# Patient Record
Sex: Male | Born: 1960
Health system: Southern US, Community
[De-identification: ages and names within clinical notes are randomized; demographics above are authoritative.]

## PROBLEM LIST (undated history)

## (undated) DIAGNOSIS — H9192 Unspecified hearing loss, left ear: Secondary | ICD-10-CM

## (undated) DIAGNOSIS — K469 Unspecified abdominal hernia without obstruction or gangrene: Secondary | ICD-10-CM

## (undated) DIAGNOSIS — K219 Gastro-esophageal reflux disease without esophagitis: Secondary | ICD-10-CM

## (undated) DIAGNOSIS — R42 Dizziness and giddiness: Secondary | ICD-10-CM

## (undated) DIAGNOSIS — G709 Myoneural disorder, unspecified: Secondary | ICD-10-CM

## (undated) DIAGNOSIS — I10 Essential (primary) hypertension: Secondary | ICD-10-CM

## (undated) DIAGNOSIS — E785 Hyperlipidemia, unspecified: Secondary | ICD-10-CM

## (undated) DIAGNOSIS — N529 Male erectile dysfunction, unspecified: Secondary | ICD-10-CM

## (undated) HISTORY — DX: Hyperlipidemia, unspecified: E78.5

## (undated) HISTORY — PX: LUMBAR DISC SURGERY: SHX700

## (undated) HISTORY — PX: OTHER SURGICAL HISTORY: SHX169

## (undated) HISTORY — DX: Dizziness and giddiness: R42

## (undated) HISTORY — DX: Myoneural disorder, unspecified: G70.9

## (undated) HISTORY — PX: HERNIA REPAIR: SHX51

## (undated) HISTORY — DX: Gastro-esophageal reflux disease without esophagitis: K21.9

## (undated) HISTORY — DX: Essential (primary) hypertension: I10

## (undated) HISTORY — DX: Male erectile dysfunction, unspecified: N52.9

## (undated) HISTORY — DX: Unspecified hearing loss, left ear: H91.92

---

## 1999-10-29 ENCOUNTER — Inpatient Hospital Stay (HOSPITAL_COMMUNITY): Admission: EM | Admit: 1999-10-29 | Discharge: 1999-11-02 | Payer: Self-pay | Admitting: Emergency Medicine

## 1999-10-30 ENCOUNTER — Encounter: Payer: Self-pay | Admitting: Internal Medicine

## 1999-11-02 ENCOUNTER — Inpatient Hospital Stay (HOSPITAL_COMMUNITY): Admission: AD | Admit: 1999-11-02 | Discharge: 1999-11-09 | Payer: Self-pay | Admitting: *Deleted

## 1999-12-06 ENCOUNTER — Other Ambulatory Visit: Admission: RE | Admit: 1999-12-06 | Discharge: 1999-12-17 | Payer: Self-pay

## 2000-02-18 ENCOUNTER — Inpatient Hospital Stay (HOSPITAL_COMMUNITY): Admission: EM | Admit: 2000-02-18 | Discharge: 2000-02-19 | Payer: Self-pay | Admitting: Emergency Medicine

## 2000-02-18 ENCOUNTER — Encounter: Payer: Self-pay | Admitting: Emergency Medicine

## 2000-02-19 ENCOUNTER — Inpatient Hospital Stay (HOSPITAL_COMMUNITY): Admission: EM | Admit: 2000-02-19 | Discharge: 2000-02-21 | Payer: Self-pay | Admitting: *Deleted

## 2002-08-24 ENCOUNTER — Encounter: Admission: RE | Admit: 2002-08-24 | Discharge: 2002-08-24 | Payer: Self-pay | Admitting: Neurosurgery

## 2002-08-24 ENCOUNTER — Encounter: Payer: Self-pay | Admitting: Neurosurgery

## 2002-09-07 ENCOUNTER — Encounter: Admission: RE | Admit: 2002-09-07 | Discharge: 2002-09-07 | Payer: Self-pay | Admitting: Neurosurgery

## 2002-09-07 ENCOUNTER — Encounter: Payer: Self-pay | Admitting: Neurosurgery

## 2002-11-28 ENCOUNTER — Encounter: Payer: Self-pay | Admitting: Neurosurgery

## 2002-11-30 ENCOUNTER — Encounter: Payer: Self-pay | Admitting: Neurosurgery

## 2002-11-30 ENCOUNTER — Ambulatory Visit (HOSPITAL_COMMUNITY): Admission: RE | Admit: 2002-11-30 | Discharge: 2002-12-01 | Payer: Self-pay | Admitting: Neurosurgery

## 2006-04-17 ENCOUNTER — Ambulatory Visit: Payer: Self-pay | Admitting: Family Medicine

## 2006-05-29 ENCOUNTER — Ambulatory Visit: Payer: Self-pay | Admitting: Family Medicine

## 2006-08-07 ENCOUNTER — Ambulatory Visit: Payer: Self-pay | Admitting: Family Medicine

## 2006-08-14 ENCOUNTER — Ambulatory Visit: Payer: Self-pay | Admitting: Family Medicine

## 2007-12-30 ENCOUNTER — Ambulatory Visit: Payer: Self-pay | Admitting: Family Medicine

## 2008-12-11 ENCOUNTER — Ambulatory Visit: Payer: Self-pay | Admitting: Family Medicine

## 2010-03-28 ENCOUNTER — Ambulatory Visit: Payer: Self-pay | Admitting: Family Medicine

## 2010-04-29 ENCOUNTER — Ambulatory Visit: Payer: Self-pay | Admitting: Family Medicine

## 2010-12-30 ENCOUNTER — Ambulatory Visit (INDEPENDENT_AMBULATORY_CARE_PROVIDER_SITE_OTHER): Payer: PRIVATE HEALTH INSURANCE | Admitting: Family Medicine

## 2010-12-30 DIAGNOSIS — R51 Headache: Secondary | ICD-10-CM

## 2010-12-30 DIAGNOSIS — I10 Essential (primary) hypertension: Secondary | ICD-10-CM

## 2011-01-28 ENCOUNTER — Ambulatory Visit (INDEPENDENT_AMBULATORY_CARE_PROVIDER_SITE_OTHER): Payer: PRIVATE HEALTH INSURANCE | Admitting: Family Medicine

## 2011-01-28 DIAGNOSIS — Z79899 Other long term (current) drug therapy: Secondary | ICD-10-CM

## 2011-01-28 DIAGNOSIS — I10 Essential (primary) hypertension: Secondary | ICD-10-CM

## 2011-01-28 DIAGNOSIS — F528 Other sexual dysfunction not due to a substance or known physiological condition: Secondary | ICD-10-CM

## 2011-03-03 ENCOUNTER — Ambulatory Visit: Payer: PRIVATE HEALTH INSURANCE | Admitting: Family Medicine

## 2011-04-04 NOTE — H&P (Signed)
[place]. [facility]  Patient:    Troy Garza, Troy Garza                          MRN: [id number] Adm. Date:  [id number] Attending:  Carollee Herter                         History and Physical  HISTORY:  This is a 50 year old white male who is admitted through the emergency room following an overdose of Remeron and an over-the-counter sleep medication in a suicide attempt.  He does have a previous suicide attempt and admission in December 2000; is presently being taken care of by Dr. Constance Holster.  He apparently took the above-mentioned pills last night around 11 p.m.; this morning was found by his mother to be unsteady on his feet, with weakness and shaking.  He was brought to the emergency room.  In the emergency room he was noted to be disoriented and hallucinating, and an extensive evaluation was performed.  This included CT scan, drug screening and blood studies; all of which were negative.  Over the course of his emergency room stay he did slowly improve, although initially he was hallucinating. When I saw him around 6 oclock he was alert and oriented, but did complain of some dizziness and shakiness.  PAST MEDICAL HISTORY:  Unremarkable, except as above; other than he does have a history of hypertension and does take Altace.  SOCIAL HISTORY:  Negative, except as above.  He had a hospitalization in December 2000 for overdose.  ALLERGIES:  None noted.  PHYSICAL EXAMINATION:  GENERAL:  Reveals a white male in no acute distress, who was alert and oriented x 3.  Does complain of being slightly dizzy and shaky.  HEENT:  TMs are normal.  Fundi not seen.  Throat is clear, although black material is present in the throat, indicating the use of activated charcoal.  NECK:  Supple without adenopathy or thyromegaly.  CARDIAC:  Shows a regular rhythm without murmurs of gallops.  LUNGS:  Clear to auscultation.  ABDOMEN:  Shows no hepatosplenomegaly, masses  or tenderness.  GENITAL/RECTAL:  Deferred.  EXTREMITIES:  Deep tendon reflexes are 2+ pulses and reflexes.  ASSESSMENT:  Suicide attempt, with overdose of Remeron and over-the-counter sleeping pill.  PLAN:  He is to be admitted to a telemetry bed with a sitter.  He is to be reevaluated in the morning by the psychiatric team for transfer to the psychiatric unit. DD:  02/18/00 TD:  02/18/00 Job: [id number]

## 2011-04-04 NOTE — H&P (Signed)
   NAMEBERNHARDT, RIEMENSCHNEIDER                             ACCOUNT NO.:  [id number]   MEDICAL RECORD NO.:  [id number]                   PATIENT TYPE:  OIB   LOCATION:  NA                                   FACILITY:  [facility]   PHYSICIAN:  Hilda Lias, M.D.                DATE OF BIRTH:  1960/12/27   DATE OF ADMISSION:  DATE OF DISCHARGE:                                HISTORY & PHYSICAL   HISTORY:  Troy Garza was seen by me initially in September 2003 and later on  in December because of a history of while Troy Garza was walking, bent over.  Immediately his foot got caught, twisted and Troy Garza developed some onset of back  pain that radiated to the left foot.  The patient is quite miserable.  Troy Garza  has had conservative treatment and Troy Garza has had no improvement whatsoever.  Troy Garza  denies any problem with the opposite leg.  Troy Garza is quite miserable .  When Troy Garza  came to see me Troy Garza was limping from the left leg.  Troy Garza had an MRI and sent for  evaluation.   PAST MEDICAL HISTORY:  Negative.   ALLERGIES:  Troy Garza is allergic to PENICILLIN.   SOCIAL HISTORY:  Troy Garza does not smoke.   FAMILY HISTORY:  Unremarkable.   REVIEW OF SYSTEMS:  Positive for high blood pressure, back pain, left leg  pain.   PHYSICAL EXAMINATION:  On physical examination patient came to the office,  limps on the left leg.  HEENT:  Normal.  NECK:  Normal.  LUNGS:  Clear.  HEART:  Heart sounds normal.  ABDOMEN:  Normal.  NEUROLOGIC:  Mental status - cranial nerves normal.  Strength 5/5, intact.  EXTREMITIES:  On the left foot Troy Garza has 3/5 plantar flexion.  Sensation is  normal.  Reflexes symmetrical with absence of the  left ankle jerk.   DIAGNOSTIC STUDIES:  The MRI shows that Troy Garza has a large herniated disk at L5-  1 central to the left with displacement of not only the thecal sac but also  the S1 nerve root.  Troy Garza also has a small midline disk between 4-5.   CLINICAL IMPRESSION:  Left L5-S1 herniated disc due to displacement of the  thecal sac.   RECOMMENDATIONS:  The patient will be admitted for surgery.  Procedure will  be L5-S1 diskectomy foraminotomy.  Troy Garza also knows the risks such as  infection, CSF leak, worsening pain, paralysis, 10% recurrence rate.                                               Hilda Lias, M.D.    EB/MEDQ  D:  11/30/2002  T:  11/30/2002  Job:  [id number]

## 2011-04-04 NOTE — Op Note (Signed)
   Troy Garza, Troy Garza                             ACCOUNT NO.:  [id number]   MEDICAL RECORD NO.:  [id number]                   PATIENT TYPE:  OIB   LOCATION:  NA                                   FACILITY:  [facility]   PHYSICIAN:  Hilda Lias, M.D.                DATE OF BIRTH:  09/20/1961   DATE OF PROCEDURE:  DATE OF DISCHARGE:                                 OPERATIVE REPORT   PREOPERATIVE DIAGNOSIS:  Left L5-S1 herniated disk with a fragment.   POSTOPERATIVE DIAGNOSIS:  Left L5-S1 herniated disk with a fragment.   PROCEDURE:  Left L5-S1 diskectomy using a Medx system, CR microscope.   DESCRIPTION OF PROCEDURE:  The patient was admitted, having back and left  leg pain for several months after he was inactive at work.  He failed  conservative treatment.  MRI showed herniated disk at L5-1 with displacement  of the thecal sac.  Surgery was advised.   The patient was taken to the operating room after intubation and he was  proceeded.  The back was prepped with Betadine.  The area was draped.  We  then brought the C-arm, localizing the AP view with L5-1 on the left side.  We made an incision through the skin, subcutaneous tissue and fascia.  Dilators were inserted, we were able to visualize the area between 5-1.  The  microscope was brought into the area.  With the drill, we drilled the lower  lamina of L5 and the upper S1 as well as part of the 1/3 of the medial  facet.  Ligament was also excised.  From then on, I identified the S1 root  which was swollen and reddish.  We did retraction and indeed, with  retraction we found that there was a large herniated disk with fragment  going into the L5, down to S1.  An incision was made and large amount of  degenerative disk was removed.  Total diskectomy, medial and laterally was  accomplished.  From there, we investigated the area above and below.  Having  good foraminotomy, the area was irrigated, Valsalva maneuver was negative.  Then,  Fentanyl and Depo-Medrol were left in the epidural space and the wound  was closed with Vicryl and Steri-Strips.                                               Hilda Lias, M.D.    EB/MEDQ  D:  11/30/2002  T:  11/30/2002  Job:  [id number]

## 2011-04-16 ENCOUNTER — Telehealth: Payer: Self-pay | Admitting: Family Medicine

## 2011-04-16 NOTE — Telephone Encounter (Signed)
Cialis is giving pt a constant head ache.  Pt would like to switch back to the Viagra, it works well.  [facility] [address].

## 2011-04-17 ENCOUNTER — Telehealth: Payer: Self-pay | Admitting: Family Medicine

## 2011-04-17 MED ORDER — SILDENAFIL CITRATE 100 MG PO TABS: 100.0000 mg | ORAL_TABLET | ORAL | Status: DC | PRN

## 2011-04-17 NOTE — Telephone Encounter (Signed)
Left msg, Rx [facility]

## 2011-04-17 NOTE — Telephone Encounter (Signed)
Apparently Cialis is causing problems with headaches. He would like to be switched back to Viagra.

## 2011-05-07 ENCOUNTER — Other Ambulatory Visit: Payer: Self-pay | Admitting: Family Medicine

## 2011-07-15 ENCOUNTER — Telehealth (INDEPENDENT_AMBULATORY_CARE_PROVIDER_SITE_OTHER): Payer: Self-pay | Admitting: General Surgery

## 2011-07-15 ENCOUNTER — Encounter (INDEPENDENT_AMBULATORY_CARE_PROVIDER_SITE_OTHER): Payer: Self-pay | Admitting: Surgery

## 2011-07-15 ENCOUNTER — Ambulatory Visit (INDEPENDENT_AMBULATORY_CARE_PROVIDER_SITE_OTHER): Payer: Self-pay | Admitting: Surgery

## 2011-07-15 VITALS — BP 156/100 | HR 81 | Temp 98.7°F | Ht 67.0 in | Wt 188.2 lb

## 2011-07-15 DIAGNOSIS — K429 Umbilical hernia without obstruction or gangrene: Secondary | ICD-10-CM | POA: Insufficient documentation

## 2011-07-15 NOTE — Patient Instructions (Signed)
My office will call you to schedule surgery.

## 2011-07-15 NOTE — Progress Notes (Signed)
Chief Complaint  Patient presents with  . Umbilical Hernia    HPI Troy Garza is a 50 y.o. male referred by [facility] for evaluation of an umbilical hernia. The patient was at work recently when he lifted a heavy object about 60 pounds. He felt a tearing sensation at his umbilicus and subsequently has developed a bulge in this area. He has been limiting his lifting but still has some discomfort in this area. He was diagnosed with an umbilical hernia and is referred now for surgical evaluation HPI  Past Medical History  Diagnosis Date  . Hypertension     Past Surgical History  Procedure Date  . Lumbar disc surgery     Family History  Problem Relation Age of Onset  . Hypertension Father     Social History History  Substance Use Topics  . Smoking status: Never Smoker   . Smokeless tobacco: Not on file  . Alcohol Use: Yes    Allergies  Allergen Reactions  . Penicillins Hives    Current Outpatient Prescriptions  Medication Sig Dispense Refill  . amLODipine (NORVASC) 5 MG tablet TAKE 1 TABLET EVERY DAY  30 tablet  2  . sildenafil (VIAGRA) 100 MG tablet Take 1 tablet (100 mg total) by mouth as needed for erectile dysfunction.  6 tablet  11    Review of Systems ROS Reviewed with the patient and is otherwise negative. Blood pressure 156/100, pulse 81, temperature 98.7 F (37.1 C), temperature source Oral, height 5\' 7"  (1.702 m), weight 188 lb 4 oz (85.39 kg).  Physical Exam Physical Exam  WDWN in NAD HEENT:  EOMI, sclera anicteric Neck:  No masses, no thyromegaly Lungs:  CTA bilaterally; normal respiratory effort CV:  Regular rate and rhythm; no murmurs Abd:  +bowel sounds, soft, non-tender, protruding umbilicus with partially reducible hernia. Upper midline rectus diastasis Ext:  Well-perfused; no edema Skin:  Warm, dry; no sign of jaundice  Data Reviewed None  Assessment    Umbilical hernia caused by heavy lifting. Asymptomatic rectus  diastases    Plan    Umbilical hernia repair with mesh. The surgical procedure has been discussed with the patient.  Potential risks, benefits, alternative treatments, and expected outcomes have been explained.  All of the patient's questions at this time have been answered.  The patient understand the proposed surgical procedure and wishes to proceed.        TSUEI,MATTHEW K. 07/15/2011, 3:35 PM

## 2011-07-22 ENCOUNTER — Ambulatory Visit (INDEPENDENT_AMBULATORY_CARE_PROVIDER_SITE_OTHER): Payer: Self-pay | Admitting: General Surgery

## 2011-08-08 ENCOUNTER — Encounter (HOSPITAL_BASED_OUTPATIENT_CLINIC_OR_DEPARTMENT_OTHER)
Admission: RE | Admit: 2011-08-08 | Discharge: 2011-08-08 | Disposition: A | Discharge status: Home / Self Care | Payer: Worker's Compensation | Source: Ambulatory Visit | Attending: Surgery | Admitting: Surgery

## 2011-08-08 LAB — BASIC METABOLIC PANEL
BUN: 12 mg/dL (ref 6–23)
CO2: 25 mEq/L (ref 19–32)
Calcium: 9.4 mg/dL (ref 8.4–10.5)
Chloride: 102 mEq/L (ref 96–112)
Creatinine, Ser: 0.83 mg/dL (ref 0.50–1.35)
GFR calc Af Amer: >60 mL/min (ref >60)
GFR calc non Af Amer: >60 mL/min (ref >60)
Glucose, Bld: 88 mg/dL (ref 70–99)
Potassium: 3.5 mEq/L (ref 3.5–5.1)
Sodium: 138 mEq/L (ref 135–145)

## 2011-08-11 NOTE — Progress Notes (Signed)
Quick Note:  This patient may proceed with surgery ______ 

## 2011-08-12 ENCOUNTER — Ambulatory Visit (HOSPITAL_BASED_OUTPATIENT_CLINIC_OR_DEPARTMENT_OTHER)
Admission: RE | Admit: 2011-08-12 | Discharge: 2011-08-12 | Disposition: A | Discharge status: Home / Self Care | Payer: Worker's Compensation | Source: Ambulatory Visit | Attending: Surgery | Admitting: Surgery

## 2011-08-12 DIAGNOSIS — Z01812 Encounter for preprocedural laboratory examination: Secondary | ICD-10-CM | POA: Insufficient documentation

## 2011-08-12 DIAGNOSIS — I10 Essential (primary) hypertension: Secondary | ICD-10-CM | POA: Insufficient documentation

## 2011-08-12 DIAGNOSIS — K429 Umbilical hernia without obstruction or gangrene: Secondary | ICD-10-CM

## 2011-08-12 DIAGNOSIS — Z01810 Encounter for preprocedural cardiovascular examination: Secondary | ICD-10-CM | POA: Insufficient documentation

## 2011-08-12 HISTORY — PX: UMBILICAL HERNIA REPAIR: SHX196

## 2011-08-13 NOTE — Op Note (Signed)
  [name], Troy Garza                 ACCOUNT NO.:  [id number]  MEDICAL RECORD NO.:  [id number]  LOCATION:                                 FACILITY:  PHYSICIAN:  Wilmon Arms. Corliss Skains, M.D.      DATE OF BIRTH:  DATE OF PROCEDURE:  08/12/2011 DATE OF DISCHARGE:                              OPERATIVE REPORT   PREOPERATIVE DIAGNOSIS:  Umbilical hernia.  POSTOPERATIVE DIAGNOSIS:  Umbilical hernia.  PROCEDURE:  Umbilical hernia repair with mesh.  SURGEON:  Wilmon Arms. Corliss Skains, MD  ANESTHESIA:  General.  INDICATIONS:  This is a 50 year old male who was recently at work lifting a heavy object when he felt a tearing sensation in his umbilicus.  Subsequently he developed a bulge in the area.  I examined and felt that he had umbilical hernia and recommended repair.  DESCRIPTION OF PROCEDURE:  The patient was brought to the operating room, placed in a supine position on the operating table.  After an adequate level of general anesthesia was obtained, the patient's periumbilical region was shaved, prepped with ChloraPrep, draped in sterile fashion.  Time-out was taken to assure proper patient and proper procedure.  We made a transverse incision below his umbilicus. Dissection was carried down in the subcutaneous tissues with cautery. We dissect the hernia sac free from the umbilical skin.  The hernia sac was dissected all the way down to the fascia.  The hernia was reduced back in the preperitoneal space.  The defect was only about 15 cm across.  We used a 4.3 cm Proceed ventral patch and inserted this in the preperitoneal space.  This was secured with four interrupted 0Novafil sutures.  The fascia was reapproximated with #1 Novafil sutures.  The base of the umbilicus was tacked down with a 3-0 Vicryl.  3-0 Vicryl was used to close the subcutaneous tissues and 4-0 Monocryl was used to close the skin.  Steri-Strips and clean dressings were applied.  The patient was extubated and brought to the  recovery room in a stable condition.  All sponge, instrument, and needle counts were correct.     Wilmon Arms. Corliss Skains, M.D.     MKT/MEDQ  D:  08/12/2011  T:  08/12/2011  Job:  [id number]  Electronically Signed by Manus Rudd M.D. on 08/13/2011 01:37:07 PM

## 2011-08-14 ENCOUNTER — Other Ambulatory Visit: Payer: Self-pay | Admitting: Family Medicine

## 2011-08-14 DIAGNOSIS — I10 Essential (primary) hypertension: Secondary | ICD-10-CM

## 2011-08-14 NOTE — Telephone Encounter (Signed)
Unsure of strength for Norvasc 5 or inc to 10mg .  Please review

## 2011-08-15 ENCOUNTER — Other Ambulatory Visit: Payer: Self-pay

## 2011-08-15 MED ORDER — AMLODIPINE BESYLATE 5 MG PO TABS: 5.0000 mg | ORAL_TABLET | Freq: Every day | ORAL | Status: DC

## 2011-08-15 MED ORDER — AMLODIPINE BESYLATE 10 MG PO TABS: 10.0000 mg | ORAL_TABLET | Freq: Every day | ORAL | Status: DC

## 2011-08-15 NOTE — Telephone Encounter (Signed)
Have sent in new strength

## 2011-08-15 NOTE — Telephone Encounter (Signed)
In his chart he has been changed to 10 mg norvasc sent in new RX and discontinue 5 mg

## 2011-08-15 NOTE — Progress Notes (Signed)
iiiiiii

## 2011-08-15 NOTE — Progress Notes (Signed)
Addended by: Laureen Ochs F on: 08/15/2011 01:44 PM   Modules accepted: Orders

## 2011-08-19 ENCOUNTER — Encounter (INDEPENDENT_AMBULATORY_CARE_PROVIDER_SITE_OTHER): Payer: Self-pay | Admitting: Surgery

## 2011-08-25 ENCOUNTER — Encounter (INDEPENDENT_AMBULATORY_CARE_PROVIDER_SITE_OTHER): Payer: Self-pay | Admitting: Surgery

## 2011-08-28 ENCOUNTER — Ambulatory Visit (INDEPENDENT_AMBULATORY_CARE_PROVIDER_SITE_OTHER): Payer: Worker's Compensation | Admitting: Surgery

## 2011-08-28 ENCOUNTER — Encounter (INDEPENDENT_AMBULATORY_CARE_PROVIDER_SITE_OTHER): Payer: Self-pay | Admitting: Surgery

## 2011-08-28 VITALS — BP 126/96 | HR 80 | Temp 98.5°F | Resp 20 | Ht 68.0 in | Wt 192.0 lb

## 2011-08-28 DIAGNOSIS — K429 Umbilical hernia without obstruction or gangrene: Secondary | ICD-10-CM

## 2011-08-28 NOTE — Patient Instructions (Signed)
Light activity - no lifting over 20 lbs for the next 3 weeks.  You may wear a back support to help protect your abdominal muscles.

## 2011-08-28 NOTE — Progress Notes (Signed)
Status post umbilical hernia on 08/12/11. We used a 4.3 cm piece of proceed ventral patch. Patient is doing well. He has a little bit of soreness at his umbilicus. Incision is healing well no sign of infection or recurrence. No seroma noted. The patient may resume light activity on Monday. He can followup with [place] on a p.r.n. basis.

## 2011-12-04 ENCOUNTER — Other Ambulatory Visit: Payer: Self-pay | Admitting: Family Medicine

## 2011-12-05 ENCOUNTER — Other Ambulatory Visit: Payer: Self-pay | Admitting: Family Medicine

## 2012-02-12 ENCOUNTER — Other Ambulatory Visit: Payer: Self-pay | Admitting: Family Medicine

## 2012-02-19 ENCOUNTER — Other Ambulatory Visit: Payer: Self-pay | Admitting: Family Medicine

## 2012-03-23 ENCOUNTER — Other Ambulatory Visit: Payer: Self-pay | Admitting: Family Medicine

## 2012-05-04 ENCOUNTER — Encounter: Payer: Self-pay | Admitting: Internal Medicine

## 2012-05-05 ENCOUNTER — Ambulatory Visit: Payer: Worker's Compensation | Admitting: Family Medicine

## 2012-05-06 ENCOUNTER — Telehealth: Payer: Self-pay | Admitting: Internal Medicine

## 2012-05-06 ENCOUNTER — Encounter: Payer: Worker's Compensation | Admitting: Family Medicine

## 2012-05-06 MED ORDER — LOSARTAN POTASSIUM-HCTZ 100-12.5 MG PO TABS: 1.0000 | ORAL_TABLET | Freq: Every day | ORAL | Status: DC

## 2012-05-06 NOTE — Telephone Encounter (Signed)
MED SENT IN 

## 2012-05-11 ENCOUNTER — Other Ambulatory Visit: Payer: Self-pay | Admitting: Family Medicine

## 2012-05-13 ENCOUNTER — Encounter: Payer: Worker's Compensation | Admitting: Family Medicine

## 2012-05-17 ENCOUNTER — Encounter: Payer: Self-pay | Admitting: Family Medicine

## 2012-05-17 ENCOUNTER — Ambulatory Visit (INDEPENDENT_AMBULATORY_CARE_PROVIDER_SITE_OTHER): Payer: BC Managed Care – PPO | Admitting: Family Medicine

## 2012-05-17 VITALS — BP 150/100 | HR 91 | Wt 198.0 lb

## 2012-05-17 DIAGNOSIS — I10 Essential (primary) hypertension: Secondary | ICD-10-CM

## 2012-05-17 MED ORDER — NEBIVOLOL HCL 5 MG PO TABS: 5.0000 mg | ORAL_TABLET | Freq: Every day | ORAL | Status: DC

## 2012-05-17 NOTE — Progress Notes (Signed)
  Subjective:    Patient ID: Troy Garza, male    DOB: 1961-04-02, 51 y.o.   MRN: [id number]  HPI He is here for recheck on his blood pressure. He has been taking both of his medications without difficulty. He does exercise and do aerobic activities regularly.  Review of Systems     Objective:   Physical Exam Alert and in no distress. Blood pressure is recorded.      Assessment & Plan:   1. Hypertension  nebivolol (BYSTOLIC) 5 MG tablet   I will have him stop the Norvasc since it does not seem to be doing any good. Switch to Bystolic. Encouraged him to continue with his physical activities.

## 2012-06-06 ENCOUNTER — Other Ambulatory Visit: Payer: Self-pay | Admitting: Family Medicine

## 2012-06-16 ENCOUNTER — Telehealth: Payer: Self-pay | Admitting: Family Medicine

## 2012-06-16 DIAGNOSIS — I10 Essential (primary) hypertension: Secondary | ICD-10-CM

## 2012-06-17 ENCOUNTER — Other Ambulatory Visit: Payer: Self-pay

## 2012-06-17 MED ORDER — NEBIVOLOL HCL 5 MG PO TABS: 5.0000 mg | ORAL_TABLET | Freq: Every day | ORAL | Status: DC

## 2012-06-17 NOTE — Telephone Encounter (Signed)
Called and left a message that sample box is ready for pick up at front

## 2012-06-17 NOTE — Telephone Encounter (Signed)
Give him samples 

## 2012-06-21 ENCOUNTER — Ambulatory Visit: Payer: BC Managed Care – PPO | Admitting: Family Medicine

## 2012-06-24 ENCOUNTER — Ambulatory Visit (INDEPENDENT_AMBULATORY_CARE_PROVIDER_SITE_OTHER): Payer: BC Managed Care – PPO | Admitting: Family Medicine

## 2012-06-24 ENCOUNTER — Encounter: Payer: Self-pay | Admitting: Family Medicine

## 2012-06-24 VITALS — BP 160/110 | HR 76 | Wt 193.0 lb

## 2012-06-24 DIAGNOSIS — I10 Essential (primary) hypertension: Secondary | ICD-10-CM | POA: Insufficient documentation

## 2012-06-24 MED ORDER — NEBIVOLOL HCL 10 MG PO TABS: 10.0000 mg | ORAL_TABLET | Freq: Every day | ORAL | Status: DC

## 2012-06-24 NOTE — Progress Notes (Signed)
  Subjective:    Patient ID: Troy Garza, male    DOB: 10-04-1961, 51 y.o.   MRN: [id number]  HPI He is here for recheck. He is not taking by systolic and Hyzaar. He is having no difficulty with them. He has been checking his pressures at home and his diastolic readings are in the low 90s.   Review of Systems     Objective:   Physical Exam Hurt and in no distress. Blood pressure is recorded       Assessment & Plan:   1. Hypertension  nebivolol (BYSTOLIC) 10 MG tablet  Bring a blood pressure cuff in and measured against ours. Take the 10 mg for the next 2 weeks and keep track of your pressure. If it's down in the 80s then stay on that dose and will see you in a month. Stay on your losartan

## 2012-06-24 NOTE — Patient Instructions (Addendum)
Bring a blood pressure cuff in and measured against ours. Take the 10 mg for the next 2 weeks and keep track of your pressure. If it's down in the 80s then stay on that dose and will see you in a month. Stay on your losartan

## 2012-07-10 ENCOUNTER — Other Ambulatory Visit: Payer: Self-pay | Admitting: Family Medicine

## 2012-07-21 ENCOUNTER — Telehealth: Payer: Self-pay | Admitting: Internal Medicine

## 2012-07-21 MED ORDER — SILDENAFIL CITRATE 100 MG PO TABS: 100.0000 mg | ORAL_TABLET | ORAL | Status: DC | PRN

## 2012-07-21 NOTE — Telephone Encounter (Signed)
Viagra called in

## 2012-07-26 ENCOUNTER — Ambulatory Visit (INDEPENDENT_AMBULATORY_CARE_PROVIDER_SITE_OTHER): Payer: BC Managed Care – PPO | Admitting: Family Medicine

## 2012-07-26 ENCOUNTER — Encounter: Payer: Self-pay | Admitting: Family Medicine

## 2012-07-26 VITALS — BP 138/88 | HR 70 | Wt 191.0 lb

## 2012-07-26 DIAGNOSIS — Z23 Encounter for immunization: Secondary | ICD-10-CM

## 2012-07-26 DIAGNOSIS — N529 Male erectile dysfunction, unspecified: Secondary | ICD-10-CM | POA: Insufficient documentation

## 2012-07-26 DIAGNOSIS — I10 Essential (primary) hypertension: Secondary | ICD-10-CM

## 2012-07-26 MED ORDER — VARDENAFIL HCL 20 MG PO TABS: 20.0000 mg | ORAL_TABLET | Freq: Every day | ORAL | Status: DC | PRN

## 2012-07-26 MED ORDER — NEBIVOLOL HCL 20 MG PO TABS: 1.0000 | ORAL_TABLET | ORAL | Status: DC

## 2012-07-26 NOTE — Progress Notes (Signed)
  Subjective:    Patient ID: Troy Garza, male    DOB: 17-Nov-1961, 51 y.o.   MRN: [id number]  HPI He is here for recheck. He is now on 20 mg of Bystolic and having no difficulty with this. He continues on his other blood pressure medication again without any problems. He did note that his insurance did not cover for Viagra. He has used Cialis in the past which apparently did not work that well.   Review of Systems     Objective:   Physical Exam  Alert and in no distress otherwise not examined blood pressure is recorded.      Assessment & Plan:   1. Hypertension  Nebivolol HCl 20 MG TABS  2. ED (erectile dysfunction)  vardenafil (LEVITRA) 20 MG tablet   continue present blood pressure medication. Also given a sample of Levitra. He will let he know how this works and whether his insurance covers this.

## 2013-03-15 ENCOUNTER — Telehealth: Payer: Self-pay | Admitting: Family Medicine

## 2013-03-15 MED ORDER — SILDENAFIL CITRATE 100 MG PO TABS: 100.0000 mg | ORAL_TABLET | ORAL | Status: DC | PRN

## 2013-03-15 NOTE — Telephone Encounter (Signed)
Viagra renewed.

## 2013-03-16 ENCOUNTER — Telehealth: Payer: Self-pay | Admitting: Family Medicine

## 2013-03-17 NOTE — Telephone Encounter (Signed)
LEFT MESSAGE FOR PT TO CHECK WITH INS ON WHICH MED IS COVER AND CALL us BACK

## 2013-03-17 NOTE — Telephone Encounter (Signed)
Have him check with his insurance and see which medication is covered better.

## 2013-06-24 ENCOUNTER — Other Ambulatory Visit: Payer: Self-pay | Admitting: Family Medicine

## 2013-06-24 NOTE — Telephone Encounter (Signed)
PATIENT NEEDS TO SCHEDULE A OFFICE VISIT BEFORE HIS MEDICATION RUNS OUT IN ORDER TO RECEIVE ANYMORE REFILLS

## 2013-08-12 ENCOUNTER — Other Ambulatory Visit: Payer: Self-pay | Admitting: Family Medicine

## 2013-08-26 ENCOUNTER — Other Ambulatory Visit: Payer: Self-pay | Admitting: Family Medicine

## 2013-09-08 ENCOUNTER — Other Ambulatory Visit: Payer: Self-pay | Admitting: Family Medicine

## 2013-10-15 ENCOUNTER — Other Ambulatory Visit: Payer: Self-pay | Admitting: Family Medicine

## 2013-10-24 ENCOUNTER — Other Ambulatory Visit: Payer: Self-pay | Admitting: Family Medicine

## 2013-12-26 ENCOUNTER — Other Ambulatory Visit: Payer: Self-pay | Admitting: Family Medicine

## 2014-01-24 ENCOUNTER — Encounter: Payer: Self-pay | Admitting: Family Medicine

## 2014-01-31 ENCOUNTER — Encounter: Payer: Self-pay | Admitting: Family Medicine

## 2014-02-06 ENCOUNTER — Telehealth: Payer: Self-pay | Admitting: Family Medicine

## 2014-02-06 ENCOUNTER — Encounter: Payer: Self-pay | Admitting: Family Medicine

## 2014-02-06 ENCOUNTER — Ambulatory Visit (INDEPENDENT_AMBULATORY_CARE_PROVIDER_SITE_OTHER): Payer: BC Managed Care – PPO | Admitting: Family Medicine

## 2014-02-06 VITALS — BP 170/92 | HR 96 | Wt 180.0 lb

## 2014-02-06 DIAGNOSIS — I10 Essential (primary) hypertension: Secondary | ICD-10-CM

## 2014-02-06 DIAGNOSIS — F439 Reaction to severe stress, unspecified: Secondary | ICD-10-CM

## 2014-02-06 DIAGNOSIS — R4589 Other symptoms and signs involving emotional state: Secondary | ICD-10-CM

## 2014-02-06 MED ORDER — ALPRAZOLAM 0.25 MG PO TABS: 0.2500 mg | ORAL_TABLET | Freq: Two times a day (BID) | ORAL | Status: DC | PRN

## 2014-02-06 NOTE — Telephone Encounter (Signed)
lm

## 2014-02-06 NOTE — Progress Notes (Signed)
   Subjective:    Patient ID: Troy Garza, male    DOB: 07-Apr-1961, 53 y.o.   MRN: [id number]  HPI He broke up with his girlfriend approximately 3 weeks ago. Apparently it was her decision. They have been together for over a year. He is quite distraught over that and initially did have suicidal ideation but quickly dispense with that thought. He has been having difficulty eating, sleeping, with anger, anxiety and mood swings. He has been married twice in the past. This has affected his work. He also has concerns over his hypertension.  Review of Systems     Objective:   Physical Exam Alert and in no distress with a slightly flat affect.       Assessment & Plan:  Stress reaction, emotional - Plan: ALPRAZolam (XANAX) 0.25 MG tablet  Hypertension  I discussed his emotional response explained that this is very much like a death. He is going to the normal the emotions that can occur with this. Reassured him that none of these are abnormal. Did recommend counseling and gave him the number of [facility]. We'll also given Xanax. We will readdress the hypertension issue with his next visit. Explained that although he is depressed I do not think an antidepressant would be appropriate at this time.

## 2014-02-06 NOTE — Patient Instructions (Signed)
Call [facility] [phone]

## 2014-02-15 ENCOUNTER — Telehealth: Payer: Self-pay | Admitting: Family Medicine

## 2014-02-15 MED ORDER — LORAZEPAM 0.5 MG PO TABS: 0.5000 mg | ORAL_TABLET | Freq: Two times a day (BID) | ORAL | Status: DC | PRN

## 2014-02-15 NOTE — Telephone Encounter (Signed)
Pt.notified

## 2014-02-15 NOTE — Telephone Encounter (Signed)
Let him know that I called in a different indication for him. Have him let me know how he is doing next week.

## 2014-02-15 NOTE — Telephone Encounter (Signed)
I will switch him to Ativan since then next doesn't seem to be working.

## 2014-02-16 ENCOUNTER — Telehealth: Payer: Self-pay

## 2014-02-16 NOTE — Telephone Encounter (Signed)
Pt called and stated that you were supposed to give him something stronger than Xanax when he was here on 02/06/14, pt states he went to the pharmacy and you hadnt sent anything in. The only thing I saw was Xanax 0.25mg  and pt said that it was not Xanax that you had discussed with him. Please advise.

## 2014-03-03 ENCOUNTER — Encounter: Payer: Self-pay | Admitting: Family Medicine

## 2014-03-03 ENCOUNTER — Ambulatory Visit (INDEPENDENT_AMBULATORY_CARE_PROVIDER_SITE_OTHER): Payer: BC Managed Care – PPO | Admitting: Family Medicine

## 2014-03-03 VITALS — BP 140/90 | HR 72 | Wt 179.0 lb

## 2014-03-03 DIAGNOSIS — R4589 Other symptoms and signs involving emotional state: Secondary | ICD-10-CM

## 2014-03-03 DIAGNOSIS — F439 Reaction to severe stress, unspecified: Secondary | ICD-10-CM

## 2014-03-03 MED ORDER — LORAZEPAM 1 MG PO TABS: 1.0000 mg | ORAL_TABLET | Freq: Two times a day (BID) | ORAL | Status: DC | PRN

## 2014-03-03 NOTE — Progress Notes (Signed)
   Subjective:    Patient ID: Troy Garza, male    DOB: 09-Apr-1961, 53 y.o.   MRN: [id number]  HPI He is here for recheck. He is now taking 1 mg of Ativan with good relief of his symptoms. He usually takes this once per day it helps him get through the whole day. He is starting to feel much more comfortable with the recent breakup. Apparently she started to date someone else which initially did cause difficulty however this point he has resolved himself to the fact that it is probably good that they're no longer together.  Review of Systems     Objective:   Physical Exam Alert and in no distress with appropriate affect       Assessment & Plan:  Stress reaction, emotional - Plan: LORazepam (ATIVAN) 1 MG tablet  I will give him 30 Ativan with a refill. Stressed the fact that with time hopefully he will need less and less of this. As long as he makes progress and the pills lasts a significant time frame, no further intervention necessary. He is comfortable with that.

## 2014-03-31 ENCOUNTER — Other Ambulatory Visit: Payer: Self-pay | Admitting: Family Medicine

## 2014-03-31 NOTE — Telephone Encounter (Signed)
Is this okay to refill? 

## 2014-03-31 NOTE — Telephone Encounter (Signed)
Called into pharmacy

## 2014-03-31 NOTE — Telephone Encounter (Signed)
im not sure it got called in back on 4/17 cause they have sent it twice today from pharmacy

## 2014-03-31 NOTE — Telephone Encounter (Signed)
Okay to renew

## 2014-04-14 ENCOUNTER — Telehealth: Payer: Self-pay | Admitting: Family Medicine

## 2014-04-14 MED ORDER — LORAZEPAM 1 MG PO TABS: ORAL_TABLET | ORAL | Status: DC

## 2014-04-14 NOTE — Telephone Encounter (Signed)
Call in the lorazepam

## 2014-04-14 NOTE — Telephone Encounter (Signed)
Medication called in 

## 2014-04-14 NOTE — Telephone Encounter (Signed)
Pt lost  losartin bottle  Needs Rx called in     [facility] [address]

## 2014-04-20 ENCOUNTER — Other Ambulatory Visit: Payer: Self-pay | Admitting: Family Medicine

## 2014-05-26 ENCOUNTER — Other Ambulatory Visit: Payer: Self-pay | Admitting: Family Medicine

## 2014-05-28 ENCOUNTER — Other Ambulatory Visit: Payer: Self-pay | Admitting: Family Medicine

## 2014-05-29 NOTE — Telephone Encounter (Signed)
Okay to renew

## 2014-05-29 NOTE — Telephone Encounter (Signed)
Is this okay to fill? 

## 2014-05-30 ENCOUNTER — Other Ambulatory Visit: Payer: Self-pay | Admitting: Family Medicine

## 2014-06-01 ENCOUNTER — Other Ambulatory Visit: Payer: Self-pay | Admitting: Family Medicine

## 2014-12-05 ENCOUNTER — Telehealth: Payer: Self-pay | Admitting: Family Medicine

## 2014-12-05 NOTE — Telephone Encounter (Signed)
Go ahead and set this up

## 2014-12-05 NOTE — Telephone Encounter (Signed)
Please call  Patient would like to be set up for colonoscopy, never had one before

## 2014-12-06 ENCOUNTER — Other Ambulatory Visit: Payer: Self-pay

## 2014-12-06 DIAGNOSIS — Z1211 Encounter for screening for malignant neoplasm of colon: Secondary | ICD-10-CM

## 2014-12-06 NOTE — Telephone Encounter (Signed)
i have put order in system and sent to [place] they will contact the pt

## 2014-12-13 ENCOUNTER — Encounter: Payer: Self-pay | Admitting: Internal Medicine

## 2015-01-15 ENCOUNTER — Ambulatory Visit (AMBULATORY_SURGERY_CENTER): Payer: Managed Care, Other (non HMO) | Admitting: *Deleted

## 2015-01-15 VITALS — Ht 68.0 in | Wt 201.4 lb

## 2015-01-15 DIAGNOSIS — Z1211 Encounter for screening for malignant neoplasm of colon: Secondary | ICD-10-CM

## 2015-01-15 NOTE — Progress Notes (Signed)
No egg or soy allergy No issues with past sedation No home 02  no diet pills emmi video to e mail

## 2015-01-26 ENCOUNTER — Encounter: Payer: Self-pay | Admitting: Internal Medicine

## 2015-02-20 ENCOUNTER — Encounter: Payer: Self-pay | Admitting: Gastroenterology

## 2015-02-26 ENCOUNTER — Encounter: Payer: Self-pay | Admitting: Internal Medicine

## 2015-03-14 ENCOUNTER — Other Ambulatory Visit: Payer: Self-pay | Admitting: Family Medicine

## 2015-03-14 NOTE — Telephone Encounter (Signed)
Called and left detailed message on vm that pt needs to call back and schedule an appt. i will send in a 30 day supply only

## 2015-03-30 ENCOUNTER — Encounter: Payer: Self-pay | Admitting: Gastroenterology

## 2015-04-18 ENCOUNTER — Other Ambulatory Visit: Payer: Self-pay | Admitting: Family Medicine

## 2015-05-26 ENCOUNTER — Other Ambulatory Visit: Payer: Self-pay | Admitting: Family Medicine

## 2015-07-20 ENCOUNTER — Other Ambulatory Visit: Payer: Self-pay | Admitting: Family Medicine

## 2015-08-16 ENCOUNTER — Encounter: Payer: Self-pay | Admitting: Family Medicine

## 2015-08-22 ENCOUNTER — Other Ambulatory Visit: Payer: Self-pay | Admitting: Family Medicine

## 2015-09-03 ENCOUNTER — Encounter: Payer: Self-pay | Admitting: Family Medicine

## 2015-09-03 ENCOUNTER — Ambulatory Visit (INDEPENDENT_AMBULATORY_CARE_PROVIDER_SITE_OTHER): Payer: Managed Care, Other (non HMO) | Admitting: Family Medicine

## 2015-09-03 VITALS — BP 150/90 | HR 88 | Ht 67.5 in | Wt 198.0 lb

## 2015-09-03 DIAGNOSIS — Z23 Encounter for immunization: Secondary | ICD-10-CM | POA: Diagnosis not present

## 2015-09-03 DIAGNOSIS — I10 Essential (primary) hypertension: Secondary | ICD-10-CM | POA: Diagnosis not present

## 2015-09-03 DIAGNOSIS — N5201 Erectile dysfunction due to arterial insufficiency: Secondary | ICD-10-CM | POA: Diagnosis not present

## 2015-09-03 DIAGNOSIS — Z9889 Other specified postprocedural states: Secondary | ICD-10-CM | POA: Diagnosis not present

## 2015-09-03 DIAGNOSIS — Z0000 Encounter for general adult medical examination without abnormal findings: Secondary | ICD-10-CM

## 2015-09-03 LAB — COMPREHENSIVE METABOLIC PANEL
ALT: 45 U/L (ref 9–46)
AST: 24 U/L (ref 10–35)
Albumin: 4.8 g/dL (ref 3.6–5.1)
Alkaline Phosphatase: 75 U/L (ref 40–115)
BUN: 18 mg/dL (ref 7–25)
CHLORIDE: 105 mmol/L (ref 98–110)
CO2: 24 mmol/L (ref 20–31)
CREATININE: 1.01 mg/dL (ref 0.70–1.33)
Calcium: 9.9 mg/dL (ref 8.6–10.3)
Glucose, Bld: 105 mg/dL — ABNORMAL HIGH (ref 65–99)
Potassium: 4.5 mmol/L (ref 3.5–5.3)
SODIUM: 140 mmol/L (ref 135–146)
Total Bilirubin: 0.6 mg/dL (ref 0.2–1.2)
Total Protein: 8 g/dL (ref 6.1–8.1)

## 2015-09-03 LAB — LIPID PANEL
Cholesterol: 249 mg/dL — ABNORMAL HIGH (ref 125–200)
HDL: 53 mg/dL (ref >=40)
LDL CALC: 169 mg/dL — AB (ref <130)
Total CHOL/HDL Ratio: 4.7 Ratio (ref <=5.0)
Triglycerides: 137 mg/dL (ref <150)
VLDL: 27 mg/dL (ref <30)

## 2015-09-03 MED ORDER — AZILSARTAN-CHLORTHALIDONE 40-12.5 MG PO TABS: 1.0000 | ORAL_TABLET | Freq: Every day | ORAL | Status: DC

## 2015-09-03 NOTE — Progress Notes (Signed)
Subjective:    Patient ID: Troy Garza, male    DOB: 11/16/1961, 54 y.o.   MRN: [id number]  HPI He is here for complete examination. He does have hypertension and continues on his current medication. He also continues have difficulty with erectile dysfunction but finds the Levitra to be expensive. He also has a previous history of left tympanoplasty and still has difficulty with that ear. Recently he had trouble with this with flying. He has not seen ENT in quite some time concerning this. He would also like to get screening for colon cancer and would like the Cologard. His work is going well. Psychologically he is doing much better and is now involved in a better long-term relationship. He has had no difficulty with abdominal pain, change in bowel habits chest pain. Family and social history as well as health maintenance and immunizations were reviewed.   Review of Systems  All other systems reviewed and are negative.      Objective:   Physical Exam BP 150/90 mmHg  Pulse 88  Ht 5' 7.5" (1.715 m)  Wt 198 lb (89.812 kg)  BMI 30.54 kg/m2  SpO2 99%  General Appearance:    Alert, cooperative, no distress, appears stated age  Head:    Normocephalic, without obvious abnormality, atraumatic  Eyes:    PERRL, conjunctiva/corneas clear, EOM's intact, fundi    benign  Ears:    right TM and canal is normal. Left canal is large with scar tissue and loss of normal landmarks.   Nose:   Nares normal, mucosa normal, no drainage or sinus   tenderness  Throat:   Lips, mucosa, and tongue normal; teeth and gums normal  Neck:   Supple, no lymphadenopathy;  thyroid:  no   enlargement/tenderness/nodules; no carotid   bruit or JVD  Back:    Spine nontender, no curvature, ROM normal, no CVA     tenderness  Lungs:     Clear to auscultation bilaterally without wheezes, rales or     ronchi; respirations unlabored  Chest Wall:    No tenderness or deformity   Heart:    Regular rate and rhythm, S1 and S2 normal,  no murmur, rub   or gallop  Breast Exam:    No chest wall tenderness, masses or gynecomastia  Abdomen:     Soft, non-tender, nondistended, normoactive bowel sounds,    no masses, no hepatosplenomegaly  Genitalia:    Normal male external genitalia without lesions.  Testicles without masses.  No inguinal hernias.  Rectal:   deferred   Extremities:   No clubbing, cyanosis or edema  Pulses:   2+ and symmetric all extremities  Skin:   Skin color, texture, turgor normal, no rashes or lesions  Lymph nodes:   Cervical, supraclavicular, and axillary nodes normal  Neurologic:   CNII-XII intact, normal strength, sensation and gait; reflexes 2+ and symmetric throughout          Psych:   Normal mood, affect, hygiene and grooming.          Assessment & Plan:  Routine general medical examination at a health care facility - Plan: CBC with Differential/Platelet, Comprehensive metabolic panel, Lipid panel  Essential hypertension - Plan: Azilsartan-Chlorthalidone (EDARBYCLOR) 40-12.5 MG TABS, CBC with Differential/Platelet, Comprehensive metabolic panel  Erectile dysfunction due to arterial insufficiency  Need for prophylactic vaccination with combined diphtheria-tetanus-pertussis (DTP) vaccine - Plan: Tdap vaccine greater than or equal to 7yo IM  History of tympanoplasty of left ear - Plan: Ambulatory  referral to ENT  his blood pressure is not adequately controlled. I will switch him to [employer] and have him return here in one month. Otherwise recommend increasing physical activity and continue to take good care of himself. We'll also refer to ENT

## 2015-09-04 LAB — CBC WITH DIFFERENTIAL/PLATELET
BASOS PCT: 1 % (ref 0–1)
Basophils Absolute: 0.1 10*3/uL (ref 0.0–0.1)
Eosinophils Absolute: 0.4 10*3/uL (ref 0.0–0.7)
Eosinophils Relative: 5 % (ref 0–5)
HEMATOCRIT: 49.7 % (ref 39.0–52.0)
Hemoglobin: 17 g/dL (ref 13.0–17.0)
LYMPHS PCT: 21 % (ref 12–46)
Lymphs Abs: 1.8 10*3/uL (ref 0.7–4.0)
MCH: 29.6 pg (ref 26.0–34.0)
MCHC: 34.2 g/dL (ref 30.0–36.0)
MCV: 86.4 fL (ref 78.0–100.0)
MPV: 10.7 fL (ref 8.6–12.4)
Monocytes Absolute: 0.6 10*3/uL (ref 0.1–1.0)
Monocytes Relative: 7 % (ref 3–12)
NEUTROS ABS: 5.5 10*3/uL (ref 1.7–7.7)
NEUTROS PCT: 66 % (ref 43–77)
Platelets: 246 10*3/uL (ref 150–400)
RBC: 5.75 MIL/uL (ref 4.22–5.81)
RDW: 13.5 % (ref 11.5–15.5)
WBC: 8.4 10*3/uL (ref 4.0–10.5)

## 2015-10-04 ENCOUNTER — Ambulatory Visit: Payer: Managed Care, Other (non HMO) | Admitting: Family Medicine

## 2015-10-08 ENCOUNTER — Ambulatory Visit (INDEPENDENT_AMBULATORY_CARE_PROVIDER_SITE_OTHER): Payer: Managed Care, Other (non HMO) | Admitting: Family Medicine

## 2015-10-08 ENCOUNTER — Encounter: Payer: Self-pay | Admitting: Family Medicine

## 2015-10-08 VITALS — BP 130/90 | HR 88 | Ht 67.5 in | Wt 200.0 lb

## 2015-10-08 DIAGNOSIS — I10 Essential (primary) hypertension: Secondary | ICD-10-CM | POA: Diagnosis not present

## 2015-10-08 MED ORDER — AMLODIPINE BESYLATE 5 MG PO TABS: 5.0000 mg | ORAL_TABLET | Freq: Every day | ORAL | Status: DC

## 2015-10-08 NOTE — Progress Notes (Signed)
   Subjective:    Patient ID: Troy Garza, male    Edarbyclor: 01-19-1961, 54 y.o.   MRN: [id number]  HPI  he is here for recheck. He is now on  Edarbyclor and having no difficulty with this.   Review of Systems     Objective:   Physical Exam  alert and in no distress. Blood pressure is recorded       Assessment & Plan:  Essential hypertension - Plan: amLODipine (NORVASC) 5 MG tablet  I will add Norvasc to his regimen and recheck here in one month

## 2015-10-23 ENCOUNTER — Telehealth: Payer: Self-pay | Admitting: *Deleted

## 2015-10-23 NOTE — Telephone Encounter (Signed)
LM of negative Cologuard.

## 2015-11-07 ENCOUNTER — Ambulatory Visit: Payer: Managed Care, Other (non HMO) | Admitting: Family Medicine

## 2015-11-20 ENCOUNTER — Encounter: Payer: Self-pay | Admitting: Family Medicine

## 2015-12-03 ENCOUNTER — Encounter: Payer: Self-pay | Admitting: Family Medicine

## 2015-12-05 ENCOUNTER — Other Ambulatory Visit: Payer: Self-pay | Admitting: Family Medicine

## 2016-04-08 ENCOUNTER — Telehealth: Payer: Self-pay | Admitting: Family Medicine

## 2016-04-08 NOTE — Telephone Encounter (Signed)
ok 

## 2016-04-08 NOTE — Telephone Encounter (Signed)
Pt called and states he needs another referral for the ENT he never went to the ENT. States that he had left ear pain and dizziness and wants to be able to fly and wants to have this checked out. Pt can be reached at  [phone] (M)

## 2016-04-09 ENCOUNTER — Other Ambulatory Visit: Payer: Self-pay

## 2016-04-09 DIAGNOSIS — R42 Dizziness and giddiness: Secondary | ICD-10-CM

## 2016-04-09 DIAGNOSIS — H9202 Otalgia, left ear: Secondary | ICD-10-CM

## 2016-04-09 NOTE — Telephone Encounter (Signed)
Faxed referral to [facility] ENT

## 2016-04-20 ENCOUNTER — Other Ambulatory Visit: Payer: Self-pay | Admitting: Family Medicine

## 2016-04-21 NOTE — Telephone Encounter (Signed)
Is this okay to refill? 

## 2016-06-21 ENCOUNTER — Emergency Department (HOSPITAL_BASED_OUTPATIENT_CLINIC_OR_DEPARTMENT_OTHER)
Admit: 2016-06-21 | Discharge: 2016-06-21 | Disposition: A | Discharge status: Home / Self Care | Payer: Managed Care, Other (non HMO) | Attending: Emergency Medicine | Admitting: Emergency Medicine

## 2016-06-21 ENCOUNTER — Encounter (HOSPITAL_COMMUNITY): Payer: Self-pay | Admitting: *Deleted

## 2016-06-21 ENCOUNTER — Emergency Department (HOSPITAL_COMMUNITY)
Admission: EM | Admit: 2016-06-21 | Discharge: 2016-06-21 | Disposition: A | Discharge status: Home / Self Care | Payer: Managed Care, Other (non HMO) | Attending: Emergency Medicine | Admitting: Emergency Medicine

## 2016-06-21 DIAGNOSIS — I82402 Acute embolism and thrombosis of unspecified deep veins of left lower extremity: Secondary | ICD-10-CM | POA: Diagnosis not present

## 2016-06-21 DIAGNOSIS — I10 Essential (primary) hypertension: Secondary | ICD-10-CM | POA: Diagnosis not present

## 2016-06-21 DIAGNOSIS — M79609 Pain in unspecified limb: Secondary | ICD-10-CM | POA: Diagnosis not present

## 2016-06-21 DIAGNOSIS — Z79899 Other long term (current) drug therapy: Secondary | ICD-10-CM | POA: Insufficient documentation

## 2016-06-21 DIAGNOSIS — M79605 Pain in left leg: Secondary | ICD-10-CM | POA: Diagnosis present

## 2016-06-21 DIAGNOSIS — M7989 Other specified soft tissue disorders: Secondary | ICD-10-CM | POA: Diagnosis not present

## 2016-06-21 LAB — BASIC METABOLIC PANEL
ANION GAP: 7 (ref 5–15)
BUN: 20 mg/dL (ref 6–20)
CALCIUM: 9.4 mg/dL (ref 8.9–10.3)
CHLORIDE: 104 mmol/L (ref 101–111)
CO2: 29 mmol/L (ref 22–32)
Creatinine, Ser: 0.88 mg/dL (ref 0.61–1.24)
GFR calc Af Amer: >60 mL/min (ref >60)
GFR calc non Af Amer: >60 mL/min (ref >60)
GLUCOSE: 129 mg/dL — AB (ref 65–99)
POTASSIUM: 3.1 mmol/L — AB (ref 3.5–5.1)
Sodium: 140 mmol/L (ref 135–145)

## 2016-06-21 LAB — CBC WITH DIFFERENTIAL/PLATELET
BASOS PCT: 1 %
Basophils Absolute: 0.1 10*3/uL (ref 0.0–0.1)
EOS ABS: 0.5 10*3/uL (ref 0.0–0.7)
Eosinophils Relative: 5 %
HCT: 41.1 % (ref 39.0–52.0)
HEMOGLOBIN: 13.9 g/dL (ref 13.0–17.0)
Lymphocytes Relative: 20 %
Lymphs Abs: 2.1 10*3/uL (ref 0.7–4.0)
MCH: 28.5 pg (ref 26.0–34.0)
MCHC: 33.8 g/dL (ref 30.0–36.0)
MCV: 84.2 fL (ref 78.0–100.0)
Monocytes Absolute: 0.6 10*3/uL (ref 0.1–1.0)
Monocytes Relative: 6 %
NEUTROS PCT: 68 %
Neutro Abs: 7.3 10*3/uL (ref 1.7–7.7)
Platelets: 189 10*3/uL (ref 150–400)
RBC: 4.88 MIL/uL (ref 4.22–5.81)
RDW: 12.7 % (ref 11.5–15.5)
WBC: 10.6 10*3/uL — AB (ref 4.0–10.5)

## 2016-06-21 MED ORDER — RIVAROXABAN (XARELTO) EDUCATION KIT FOR DVT/PE PATIENTS
PACK | Freq: Once | Status: AC
  Administered 2016-06-21: 21:00:00
  Filled 2016-06-21: qty 1

## 2016-06-21 MED ORDER — RIVAROXABAN (XARELTO) VTE STARTER PACK (15 & 20 MG): ORAL_TABLET | ORAL | 0 refills | Status: DC

## 2016-06-21 MED ORDER — RIVAROXABAN 15 MG PO TABS
15.0000 mg | ORAL_TABLET | Freq: Once | ORAL | Status: AC
  Administered 2016-06-21: 15 mg via ORAL
  Filled 2016-06-21: qty 1

## 2016-06-21 NOTE — Progress Notes (Signed)
VASCULAR LAB PRELIMINARY  PRELIMINARY  PRELIMINARY  PRELIMINARY  Bilateral lower extremity venous duplex completed.    Preliminary report:  There is acute, occlusive DVT noted in the left posterior tibial, peroneal, popliteal, and mid to distal femoral veins. No propagation noted to the right lower extremity.   Gave report to pt's RN.  Lilliauna Van, RVT 06/21/2016, 7:19 PM

## 2016-06-21 NOTE — ED Provider Notes (Signed)
[place] DEPT Provider Note   CSN: [id number] Arrival date & time: 06/21/16  1404  First Provider Contact:  First MD Initiated Contact with Patient 06/21/16 1728        History   Chief Complaint Chief Complaint  Patient presents with  . Leg Pain    HPI Lannis Heimbuch is a 55 y.o. male.  Had a 1.5 hour flight 2 weeks ago. No smoking. Brother had history of provoked VTE from leg injury.    The history is provided by the patient.  Leg Pain   This is a new problem. Episode onset: 5 days ago. The problem occurs constantly. The problem has not changed since onset.The pain is present in the left knee. The quality of the pain is described as aching. The pain is moderate. Associated symptoms include stiffness. He has tried nothing for the symptoms. The treatment provided no relief. There has been no history of extremity trauma.    Past Medical History:  Diagnosis Date  . ED (erectile dysfunction)   . GERD (gastroesophageal reflux disease)    hx of  . Hearing loss in left ear   . Hyperlipidemia   . Hypertension   . Neuromuscular disorder ([place])    hiatal hernia  . 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 History of tympanoplasty of left ear 09/03/2015  . ED (erectile dysfunction) 07/26/2012  . Hypertension 06/24/2012    Past Surgical History:  Procedure Laterality Date  . ear surgery Left   . HERNIA REPAIR  as baby  . LUMBAR DISC SURGERY  8 years ago  . UMBILICAL HERNIA REPAIR  08/12/11       Home Medications    Prior to Admission medications   Medication Sig Start Date End Date Taking? Authorizing Provider  amLODipine (NORVASC) 5 MG tablet TAKE 1 TABLET DAILY. 04/21/16   Denita Lung, MD  EDARBYCLOR 40-12.5 MG TABS TAKE 1 TABLET DAILY. 04/21/16   Denita Lung, MD  ibuprofen (ADVIL,MOTRIN) 200 MG tablet Take 400 mg by mouth every 6 (six) hours as needed.    Historical  Provider, MD  Javier Docker Oil 300 MG CAPS Take by mouth.    Historical Provider, MD  vardenafil (LEVITRA) 20 MG tablet Take 1 tablet (20 mg total) by mouth daily as needed for erectile dysfunction. 07/26/12 09/03/15  Denita Lung, MD    Family History Family History  Problem Relation Age of Onset  . Hypertension Father   . Colon polyps Father   . Heart disease Father   . Brain cancer Father   . Kidney cancer Father   . Bone cancer Father   . Liver cancer Father   . Colon cancer Neg Hx     Social History Social History  Substance Use Topics  . Smoking status: Never Smoker  . Smokeless tobacco: Never Used  . Alcohol use 0.0 oz/week     Comment: socially     Allergies   Penicillins   Review of Systems Review of Systems  Musculoskeletal: Positive for stiffness.  All other systems reviewed and are negative.    Physical Exam Updated Vital Signs BP 148/90 (BP Location: Left Arm)   Pulse 74   Temp 98.6 F (37 C)   Resp 18   SpO2 97%   Physical Exam  Constitutional: He is oriented to person, place, and time. He appears well-developed and  well-nourished. No distress.  HENT:  Head: Normocephalic and atraumatic.  Eyes: Conjunctivae are normal.  Neck: Neck supple. No tracheal deviation present.  Cardiovascular: Normal rate and regular rhythm.   Pulmonary/Chest: Effort normal. No respiratory distress.  Abdominal: Soft. He exhibits no distension.  Musculoskeletal:  Slight asymmetric left calf swelling with fullness in popliteal area on left.   Neurological: He is alert and oriented to person, place, and time.  Skin: Skin is warm and dry.  Psychiatric: He has a normal mood and affect.     ED Treatments / Results  Labs (all labs ordered are listed, but only abnormal results are displayed) Labs Reviewed  CBC WITH DIFFERENTIAL/PLATELET - Abnormal; Notable for the following:       Result Value   WBC 10.6 (*)    All other components within normal limits  BASIC METABOLIC  PANEL - Abnormal; Notable for the following:    Potassium 3.1 (*)    Glucose, Bld 129 (*)    All other components within normal limits    EKG  EKG Interpretation None       Radiology No results found.  Procedures Procedures (including critical care time)  Medications Ordered in ED Medications - No data to display   Initial Impression / Assessment and Plan / ED Course  I have reviewed the triage vital signs and the nursing notes.  Pertinent labs & imaging results that were available during my care of the patient were reviewed by me and considered in my medical decision making (see chart for details).  Clinical Course    55 y.o. male presents with left knee pain and swelling going to calf. No previous h/o DVT, sent from UC for r/o. Swelling and pain is asymmetric so feel that DVT r/o is appropriate.  Pt does have extensive DVT. He is otherwise comfortable and not a candidate for lytics. Xarelto starter pack provided. Plan to follow up with PCP as needed and return precautions discussed for worsening or new concerning symptoms.   Final Clinical Impressions(s) / ED Diagnoses   Final diagnoses:  Leg DVT (deep venous thromboembolism), acute, left [facility])    New Prescriptions Discharge Medication List as of 06/21/2016  8:40 PM    START taking these medications   Details  Rivaroxaban 15 & 20 MG TBPK Take as directed on package: Start with one 15mg  tablet by mouth twice a day with food. On Day 22, switch to one 20mg  tablet once a day with food., Print         Leo Grosser, MD 06/22/16 [phone]

## 2016-06-21 NOTE — ED Notes (Signed)
No respiratory or acute distress noted alert and oriented x 3 clear speech noted moves all extremities visitors at bedside call light in reach.

## 2016-06-21 NOTE — ED Triage Notes (Signed)
Pt complains of left knee pain and swelling since Monday, which became worse today. Pt went to urgent care and was told to come to ED to get ultrasound for possible DVT.

## 2016-06-21 NOTE — Discharge Instructions (Signed)
Information on my medicine - XARELTO (rivaroxaban)  This medication education was reviewed with me or my healthcare representative as part of my discharge preparation.  The pharmacist that spoke with me during my hospital stay was:  [place]? Xarelto was prescribed to treat blood clots that may have been found in the veins of your legs (deep vein thrombosis) or in your lungs (pulmonary embolism) and to reduce the risk of them occurring again.  What do you need to know about Xarelto? The starting dose is one 15 mg tablet taken TWICE daily with food for the FIRST 21 DAYS then on 8/27  the dose is changed to one 20 mg tablet taken ONCE A DAY with your evening meal.  DO NOT stop taking Xarelto without talking to the health care provider who prescribed the medication.  Refill your prescription for 20 mg tablets before you run out.  After discharge, you should have regular check-up appointments with your healthcare provider that is prescribing your Xarelto.  In the future your dose may need to be changed if your kidney function changes by a significant amount.  What do you do if you miss a dose? If you are taking Xarelto TWICE DAILY and you miss a dose, take it as soon as you remember. You may take two 15 mg tablets (total 30 mg) at the same time then resume your regularly scheduled 15 mg twice daily the next day.  If you are taking Xarelto ONCE DAILY and you miss a dose, take it as soon as you remember on the same day then continue your regularly scheduled once daily regimen the next day. Do not take two doses of Xarelto at the same time.   Important Safety Information Xarelto is a blood thinner medicine that can cause bleeding. You should call your healthcare provider right away if you experience any of the following: ? Bleeding from an injury or your nose that does not stop. ? Unusual colored urine (red or dark brown) or unusual colored stools (red or  black). ? Unusual bruising for unknown reasons. ? A serious fall or if you hit your head (even if there is no bleeding).  Some medicines may interact with Xarelto and might increase your risk of bleeding while on Xarelto. To help avoid this, consult your healthcare provider or pharmacist prior to using any new prescription or non-prescription medications, including herbals, vitamins, non-steroidal anti-inflammatory drugs (NSAIDs) and supplements.  This website has more information on Xarelto: [email or web address]

## 2016-06-23 ENCOUNTER — Telehealth: Payer: Self-pay | Admitting: Family Medicine

## 2016-06-23 NOTE — Telephone Encounter (Signed)
He needs to be seen in 2 weeks

## 2016-06-23 NOTE — Telephone Encounter (Signed)
Pt states was just in hospital for DVT & was put Xarelto & wanted to know when he should follow up with you ? Please advise

## 2016-06-24 ENCOUNTER — Ambulatory Visit (INDEPENDENT_AMBULATORY_CARE_PROVIDER_SITE_OTHER): Payer: PRIVATE HEALTH INSURANCE | Admitting: Family Medicine

## 2016-06-24 ENCOUNTER — Encounter: Payer: Self-pay | Admitting: Family Medicine

## 2016-06-24 VITALS — BP 150/100 | HR 88 | Wt 210.0 lb

## 2016-06-24 DIAGNOSIS — I82442 Acute embolism and thrombosis of left tibial vein: Secondary | ICD-10-CM | POA: Diagnosis not present

## 2016-06-24 NOTE — Progress Notes (Signed)
   Subjective:    Patient ID: Troy Garza, male    DOB: 1961/09/07, 55 y.o.   MRN: [id number]  HPI He is here for follow-up visit after recent diagnosis of DVT. Presently he is on Xarelto 15 mg twice per day. He does complain of a slight fever when he takes a Xarelto. He is to having some left calf discomfort. He will be getting ready go back to work in several days.   Review of Systems     Objective:   Physical Exam Alert and in no distress otherwise not examined       Assessment & Plan:  Acute deep vein thrombosis (DVT) of tibial vein of left lower extremity ([place]) We discussed the Xarelto in detail. At this point he would like to continue on the medication and see if the fever goes away otherwise I will switch him to different medication. Also since there was no precipitating factors concerning a DVT, will probably do a hypercoagulable workup when we finished. He is aware of this and comfortable with that. He will let me know in the next week if he is tolerating the Xarelto.

## 2016-06-24 NOTE — Telephone Encounter (Signed)
Called pt and left message

## 2016-06-24 NOTE — Patient Instructions (Signed)
You can take Tylenol for pain. 

## 2016-07-22 ENCOUNTER — Telehealth: Payer: Self-pay | Admitting: Family Medicine

## 2016-07-22 MED ORDER — RIVAROXABAN 20 MG PO TABS: 20.0000 mg | ORAL_TABLET | Freq: Every day | ORAL | 2 refills | Status: DC

## 2016-07-22 NOTE — Telephone Encounter (Signed)
ALERT PT MADE AN APPT FOR Thursday 07/24/2016. Pt needs a refill on Xarelto. He took his last one yesterday. Pt uses [facility] and can be reached at [phone].

## 2016-07-24 ENCOUNTER — Ambulatory Visit (INDEPENDENT_AMBULATORY_CARE_PROVIDER_SITE_OTHER): Payer: PRIVATE HEALTH INSURANCE | Admitting: Family Medicine

## 2016-07-24 ENCOUNTER — Encounter: Payer: Self-pay | Admitting: Family Medicine

## 2016-07-24 VITALS — BP 132/88 | HR 90 | Wt 200.0 lb

## 2016-07-24 DIAGNOSIS — Z23 Encounter for immunization: Secondary | ICD-10-CM

## 2016-07-24 DIAGNOSIS — N5201 Erectile dysfunction due to arterial insufficiency: Secondary | ICD-10-CM

## 2016-07-24 DIAGNOSIS — I82442 Acute embolism and thrombosis of left tibial vein: Secondary | ICD-10-CM | POA: Diagnosis not present

## 2016-07-24 DIAGNOSIS — Z832 Family history of diseases of the blood and blood-forming organs and certain disorders involving the immune mechanism: Secondary | ICD-10-CM | POA: Insufficient documentation

## 2016-07-24 MED ORDER — AVANAFIL 200 MG PO TABS: 1.0000 | ORAL_TABLET | Freq: Every day | ORAL | 5 refills | Status: DC

## 2016-07-24 NOTE — Progress Notes (Signed)
   Subjective:    Patient ID: Troy Garza, male    DOB: 1961-05-05, 55 y.o.   MRN: [id number]  HPI He is here for consult concerning his underlying DVT. He did find out that his maternal grandfather apparently had factor V Leiden. He also has some underlying erectile dysfunction and would like to try [facility]   Review of Systems     Objective:   Physical Exam Alert and in no distress otherwise not examined       Assessment & Plan:  Need for prophylactic vaccination and inoculation against influenza - Plan: Flu Vaccine QUAD 36+ mos PF IM (Fluarix & Fluzone Quad PF)  Acute deep vein thrombosis (DVT) of tibial vein of left lower extremity (HCC)  Erectile dysfunction due to arterial insufficiency - Plan: Avanafil (STENDRA) 200 MG TABS I explained that we can check a hypercoagulable panel on him when he finishes the Xarelto. I will also write for STENDRA. Shot also given today.

## 2016-11-01 ENCOUNTER — Telehealth: Payer: Self-pay | Admitting: Family Medicine

## 2016-11-01 NOTE — Telephone Encounter (Signed)
P.A. EDARBYCLOR  

## 2016-11-11 NOTE — Telephone Encounter (Signed)
Called ins due to no response from electronic P.A. & was approved by phone t# [phone] til 11/11/17 [id number] .  Called pt & informed

## 2016-11-18 ENCOUNTER — Encounter: Payer: Self-pay | Admitting: Family Medicine

## 2016-11-18 ENCOUNTER — Ambulatory Visit (INDEPENDENT_AMBULATORY_CARE_PROVIDER_SITE_OTHER): Payer: BLUE CROSS/BLUE SHIELD | Admitting: Family Medicine

## 2016-11-18 VITALS — BP 130/100 | HR 81 | Wt 206.8 lb

## 2016-11-18 DIAGNOSIS — N529 Male erectile dysfunction, unspecified: Secondary | ICD-10-CM

## 2016-11-18 DIAGNOSIS — I82442 Acute embolism and thrombosis of left tibial vein: Secondary | ICD-10-CM | POA: Diagnosis not present

## 2016-11-18 DIAGNOSIS — N5201 Erectile dysfunction due to arterial insufficiency: Secondary | ICD-10-CM | POA: Diagnosis not present

## 2016-11-18 MED ORDER — SILDENAFIL CITRATE 100 MG PO TABS: 100.0000 mg | ORAL_TABLET | ORAL | 0 refills | Status: DC | PRN

## 2016-11-18 NOTE — Progress Notes (Signed)
   Subjective:    Patient ID: Troy Garza, male    DOB: 07-Jun-1961, 56 y.o.   MRN: [id number]  HPI He is here for an interval evaluation. He did have a previous history of DVT was no precipitating event. He is here to have a hypercoagulable study drawn because of that. He also has underlying ED and needs to be on a different medication due to insurance.   Review of Systems     Objective:   Physical Exam Her tendon no distress otherwise not examined       Assessment & Plan:  Acute deep vein thrombosis (DVT) of tibial vein of left lower extremity (HCC) - Plan: Hypercoagulable panel, comprehensive, Antithrombin III, Protein C activity, Protein C, total, Protein S activity, Protein S, total, Lupus anticoagulant panel, Beta-2-glycoprotein i abs, IgG/M/A, Homocysteine, serum, Factor 5 leiden, Prothrombin gene mutation, Cardiolipin antibodies, IgG, IgM, IgA  Erectile dysfunction due to arterial insufficiency  Erectile dysfunction, unspecified erectile dysfunction type - Plan: sildenafil (VIAGRA) 100 MG tablet

## 2016-11-19 LAB — HOMOCYSTEINE: HOMOCYSTEINE: 11.2 umol/L (ref <11.4)

## 2016-11-19 LAB — CARDIOLIPIN ANTIBODIES, IGG, IGM, IGA
Anticardiolipin IgA: <11 [APL'U]
Anticardiolipin IgG: <14 [GPL'U]
Anticardiolipin IgM: <12 [MPL'U]

## 2016-11-20 LAB — RFX DRVVT SCR W/RFLX CONF 1:1 MIX: DRVVT SCREEN: 37 s (ref <=45)

## 2016-11-20 LAB — LUPUS ANTICOAGULANT PANEL

## 2016-11-20 LAB — RFX PTT-LA W/RFX TO HEX PHASE CONF: PTT-LA SCREEN: 34 s (ref <=40)

## 2016-11-22 LAB — PROTEIN C ACTIVITY: Protein C Activity: 193 % — ABNORMAL HIGH (ref 70–180)

## 2016-11-22 LAB — PROTEIN S, TOTAL: PROTEIN S ANTIGEN, TOTAL: 116 % (ref 70–140)

## 2016-11-22 LAB — ANTITHROMBIN III: ANTITHROMB III FUNC: 118 %{activity} (ref 80–120)

## 2016-11-22 LAB — PROTEIN C, TOTAL: Protein C Antigen: 123 % (ref 70–140)

## 2016-11-22 LAB — PROTEIN S ACTIVITY: PROTEIN S ACTIVITY: 105 % (ref 70–150)

## 2016-11-24 LAB — FACTOR 5 LEIDEN

## 2016-11-24 LAB — PROTHROMBIN GENE MUTATION

## 2016-11-25 ENCOUNTER — Other Ambulatory Visit: Payer: Self-pay | Admitting: Family Medicine

## 2017-02-17 ENCOUNTER — Ambulatory Visit (INDEPENDENT_AMBULATORY_CARE_PROVIDER_SITE_OTHER): Payer: BLUE CROSS/BLUE SHIELD | Admitting: Family Medicine

## 2017-02-17 ENCOUNTER — Encounter: Payer: Self-pay | Admitting: Family Medicine

## 2017-02-17 VITALS — BP 150/110 | HR 78 | Ht 68.0 in | Wt 203.0 lb

## 2017-02-17 DIAGNOSIS — N529 Male erectile dysfunction, unspecified: Secondary | ICD-10-CM

## 2017-02-17 DIAGNOSIS — R6882 Decreased libido: Secondary | ICD-10-CM

## 2017-02-17 DIAGNOSIS — I10 Essential (primary) hypertension: Secondary | ICD-10-CM | POA: Diagnosis not present

## 2017-02-17 LAB — CBC WITH DIFFERENTIAL/PLATELET
BASOS ABS: 80 {cells}/uL (ref 0–200)
Basophils Relative: 1 %
EOS PCT: 5 %
Eosinophils Absolute: 400 cells/uL (ref 15–500)
HCT: 45 % (ref 38.5–50.0)
Hemoglobin: 15.2 g/dL (ref 13.2–17.1)
LYMPHS ABS: 2160 {cells}/uL (ref 850–3900)
Lymphocytes Relative: 27 %
MCH: 29.1 pg (ref 27.0–33.0)
MCHC: 33.8 g/dL (ref 32.0–36.0)
MCV: 86.2 fL (ref 80.0–100.0)
MPV: 10.5 fL (ref 7.5–12.5)
Monocytes Absolute: 480 cells/uL (ref 200–950)
Monocytes Relative: 6 %
NEUTROS PCT: 61 %
Neutro Abs: 4880 cells/uL (ref 1500–7800)
Platelets: 228 10*3/uL (ref 140–400)
RBC: 5.22 MIL/uL (ref 4.20–5.80)
RDW: 13.5 % (ref 11.0–15.0)
WBC: 8 10*3/uL (ref 4.0–10.5)

## 2017-02-17 LAB — COMPREHENSIVE METABOLIC PANEL
ALBUMIN: 4.5 g/dL (ref 3.6–5.1)
ALT: 38 U/L (ref 9–46)
AST: 25 U/L (ref 10–35)
Alkaline Phosphatase: 54 U/L (ref 40–115)
BILIRUBIN TOTAL: 0.5 mg/dL (ref 0.2–1.2)
BUN: 20 mg/dL (ref 7–25)
CHLORIDE: 109 mmol/L (ref 98–110)
CO2: 23 mmol/L (ref 20–31)
CREATININE: 1.06 mg/dL (ref 0.70–1.33)
Calcium: 9.5 mg/dL (ref 8.6–10.3)
GLUCOSE: 100 mg/dL — AB (ref 65–99)
Potassium: 3.6 mmol/L (ref 3.5–5.3)
SODIUM: 142 mmol/L (ref 135–146)
Total Protein: 7.2 g/dL (ref 6.1–8.1)

## 2017-02-17 MED ORDER — SILDENAFIL CITRATE 100 MG PO TABS: 100.0000 mg | ORAL_TABLET | ORAL | 5 refills | Status: DC | PRN

## 2017-02-17 MED ORDER — AMLODIPINE BESYLATE 5 MG PO TABS: 5.0000 mg | ORAL_TABLET | Freq: Every day | ORAL | 3 refills | Status: DC

## 2017-02-17 MED ORDER — AZILSARTAN-CHLORTHALIDONE 40-12.5 MG PO TABS: 1.0000 | ORAL_TABLET | Freq: Every day | ORAL | 11 refills | Status: DC

## 2017-02-17 NOTE — Progress Notes (Signed)
   Subjective:    Patient ID: Troy Garza, male    DOB: 1961-03-17, 56 y.o.   MRN: [id number]  HPI He complains of a two-month history of difficulty with decreased libido but no change in strength, fatigue, stamina. He has been under no undue stress. He did move in with his girlfriend in November and things are going well except sexually. He continues on his present medications and has had no difficulties with them. No fever, chills, over-the-counter medications. He is using no street drugs. He also needs his blood pressure medication renewed as well as Viagra. Although at this time he is not really had the desire to have Viagra.   Review of Systems     Objective:   Physical Exam Alert and in no distress. Tympanic membrane on the left shows previous surgical damage, right is normal, canals are normal. Pharyngeal area is normal. Neck is supple without adenopathy or thyromegaly. Cardiac exam shows a regular sinus rhythm without murmurs or gallops. Lungs are clear to auscultation.        Assessment & Plan:  Erectile dysfunction, unspecified erectile dysfunction type - Plan: sildenafil (VIAGRA) 100 MG tablet  Essential hypertension - Plan: Azilsartan-Chlorthalidone (EDARBYCLOR) 40-12.5 MG TABS, amLODipine (NORVASC) 5 MG tablet  Decreased libido - Plan: Testosterone, CBC with Differential/Platelet, Comprehensive metabolic panel His blood pressure is elevated and we will need to monitor this. His meds were renewed.

## 2017-02-18 LAB — TESTOSTERONE: TESTOSTERONE: 234 ng/dL — AB (ref 250–827)

## 2017-02-22 ENCOUNTER — Telehealth: Payer: Self-pay | Admitting: Family Medicine

## 2017-02-22 NOTE — Telephone Encounter (Signed)
Initiated P.A. SILDENAFIL

## 2017-02-23 ENCOUNTER — Other Ambulatory Visit: Payer: BLUE CROSS/BLUE SHIELD

## 2017-02-23 DIAGNOSIS — E291 Testicular hypofunction: Secondary | ICD-10-CM

## 2017-02-24 LAB — TESTOSTERONE TOTAL,FREE,BIO, MALES
ALBUMIN: 4.3 g/dL (ref 3.6–5.1)
Sex Hormone Binding: 33 nmol/L (ref 10–50)
Testosterone, Bioavailable: 93.9 ng/dL — ABNORMAL LOW (ref 110.0–575.0)
Testosterone, Free: 47.7 pg/mL (ref 46.0–224.0)
Testosterone: 364 ng/dL (ref 250–827)

## 2017-02-25 ENCOUNTER — Telehealth: Payer: Self-pay

## 2017-02-25 MED ORDER — SILDENAFIL CITRATE 20 MG PO TABS: ORAL_TABLET | ORAL | 5 refills | Status: DC

## 2017-02-25 MED ORDER — TESTOSTERONE 20.25 MG/ACT (1.62%) TD GEL: 2.0000 "application " | Freq: Every day | TRANSDERMAL | 5 refills | Status: DC

## 2017-02-25 NOTE — Telephone Encounter (Signed)
Faxed request rcvd from [facility] on this pt's Viagra- ins will not cover sildenafil 100mg  and they request new script for sidenafil 20mg . Victorino December

## 2017-03-04 ENCOUNTER — Telehealth: Payer: Self-pay | Admitting: Family Medicine

## 2017-03-04 NOTE — Telephone Encounter (Signed)
Called Androgel into pharmacy & does require P.A. With [employer] t# [phone] ID# [id number]

## 2017-03-20 NOTE — Telephone Encounter (Signed)
P.A. Denied insurance will only cover for pulmonary arterial hypertension.  Called pharmacy & patient already picked up & paid out of pocket $98 for #50 pills

## 2017-03-20 NOTE — Telephone Encounter (Signed)
P.A. Approved, called pharmacy & went thru for $606.  Activated discount card and took off $100 so still $506.  Pt has $2736 remaining on his deductible.  Left message for pt.  If he can't afford will see if would be candidate for compounding option.

## 2017-04-23 ENCOUNTER — Institutional Professional Consult (permissible substitution): Payer: BLUE CROSS/BLUE SHIELD | Admitting: Family Medicine

## 2017-04-27 ENCOUNTER — Encounter: Payer: Self-pay | Admitting: Family Medicine

## 2017-04-27 ENCOUNTER — Ambulatory Visit (INDEPENDENT_AMBULATORY_CARE_PROVIDER_SITE_OTHER): Payer: BLUE CROSS/BLUE SHIELD | Admitting: Family Medicine

## 2017-04-27 VITALS — BP 132/80 | HR 68 | Resp 18 | Wt 202.2 lb

## 2017-04-27 DIAGNOSIS — N529 Male erectile dysfunction, unspecified: Secondary | ICD-10-CM

## 2017-04-27 DIAGNOSIS — E291 Testicular hypofunction: Secondary | ICD-10-CM | POA: Diagnosis not present

## 2017-04-27 MED ORDER — TESTOSTERONE 20.25 MG/ACT (1.62%) TD GEL: 2.0000 "application " | Freq: Every day | TRANSDERMAL | 5 refills | Status: DC

## 2017-04-27 NOTE — Progress Notes (Signed)
   Subjective:    Patient ID: Troy Garza, male    DOB: 03/14/1961, 56 y.o.   MRN: [id number]  HPI He is here for consult concerning libido and low testosterone. He has had 2 testosterone levels drawn, one was below the range and one was slightly above it. Further testing does show bioavailability below the normal of 110. He is at 93.9. He is also having erectile dysfunction issues. He has a previous history of DVT and did get follow-up hypercoagulable studies which was negative. He does not smoke.   Review of Systems     Objective:   Physical Exam Alert and in no distress otherwise not examined       Assessment & Plan:  Male hypogonadism - Plan: Testosterone (ANDROGEL PUMP) 20.25 MG/ACT (1.62%) GEL  Erectile dysfunction, unspecified erectile dysfunction type I discussed the diagnosis of hypogonadism and the risk of taking testosterone in regard to DVT especially with his previous history of this. Also discussed the possible effect on prostate in terms of prostate cancer. He understands this and is willing to accept that risk. Also discussed the fact that the fetus insurance does not necessarily cover this, we will then call in 2 custom care for compounded formulation.

## 2017-09-15 ENCOUNTER — Encounter: Payer: Self-pay | Admitting: Family Medicine

## 2017-09-15 ENCOUNTER — Telehealth: Payer: Self-pay | Admitting: Family Medicine

## 2017-09-15 ENCOUNTER — Ambulatory Visit (INDEPENDENT_AMBULATORY_CARE_PROVIDER_SITE_OTHER): Payer: BLUE CROSS/BLUE SHIELD | Admitting: Family Medicine

## 2017-09-15 VITALS — BP 136/88 | HR 97 | Wt 203.0 lb

## 2017-09-15 DIAGNOSIS — F419 Anxiety disorder, unspecified: Secondary | ICD-10-CM | POA: Diagnosis not present

## 2017-09-15 MED ORDER — LORAZEPAM 0.5 MG PO TABS: 0.5000 mg | ORAL_TABLET | Freq: Two times a day (BID) | ORAL | 0 refills | Status: DC | PRN

## 2017-09-15 NOTE — Progress Notes (Signed)
   Subjective:    Patient ID: Troy Garza, male    DOB: 01-20-1961, 56 y.o.   MRN: [id number]  HPI He is here for consult concerning difficulty with anxiety. He broke up with his girlfriend several months ago and recently found out she is now dating someone else and he has been quite upset over this. He in fact did miss work today and has had difficulty with sleep. In the past he has done well on Ativan. He does have several friends that he is confiding in.   Review of Systems     Objective:   Physical Exam Alert and in no distress. Slightly flat affect.       Assessment & Plan:  Anxiety - Plan: LORazepam (ATIVAN) 0.5 MG tablet I will give him Ativan as this has worked in the past. Discussed counseling with him over this point he is going to rely on his friends and see if he can work through this on his own.

## 2017-09-28 ENCOUNTER — Other Ambulatory Visit: Payer: Self-pay | Admitting: Family Medicine

## 2017-09-28 DIAGNOSIS — F419 Anxiety disorder, unspecified: Secondary | ICD-10-CM

## 2017-09-28 NOTE — Telephone Encounter (Signed)
Called in lorazepam. Victorino December

## 2017-09-28 NOTE — Telephone Encounter (Signed)
ok 

## 2017-09-28 NOTE — Telephone Encounter (Signed)
Ok to renew?  

## 2017-12-26 ENCOUNTER — Telehealth: Payer: Self-pay | Admitting: Family Medicine

## 2017-12-26 NOTE — Telephone Encounter (Signed)
P.A. Carole Binning unable to submit thru cover my meds states must call t# [phone] Monday

## 2017-12-29 ENCOUNTER — Emergency Department (HOSPITAL_COMMUNITY): Payer: BLUE CROSS/BLUE SHIELD

## 2017-12-29 ENCOUNTER — Other Ambulatory Visit: Payer: Self-pay

## 2017-12-29 ENCOUNTER — Encounter (HOSPITAL_COMMUNITY): Payer: Self-pay | Admitting: *Deleted

## 2017-12-29 ENCOUNTER — Emergency Department (HOSPITAL_COMMUNITY)
Admission: EM | Admit: 2017-12-29 | Discharge: 2017-12-29 | Disposition: A | Discharge status: Home / Self Care | Payer: BLUE CROSS/BLUE SHIELD | Attending: Emergency Medicine | Admitting: Emergency Medicine

## 2017-12-29 DIAGNOSIS — I10 Essential (primary) hypertension: Secondary | ICD-10-CM | POA: Insufficient documentation

## 2017-12-29 DIAGNOSIS — I674 Hypertensive encephalopathy: Secondary | ICD-10-CM

## 2017-12-29 DIAGNOSIS — R42 Dizziness and giddiness: Secondary | ICD-10-CM | POA: Diagnosis not present

## 2017-12-29 DIAGNOSIS — R29898 Other symptoms and signs involving the musculoskeletal system: Secondary | ICD-10-CM | POA: Diagnosis not present

## 2017-12-29 DIAGNOSIS — R202 Paresthesia of skin: Secondary | ICD-10-CM | POA: Diagnosis not present

## 2017-12-29 DIAGNOSIS — Z79899 Other long term (current) drug therapy: Secondary | ICD-10-CM | POA: Insufficient documentation

## 2017-12-29 DIAGNOSIS — R0789 Other chest pain: Secondary | ICD-10-CM | POA: Diagnosis not present

## 2017-12-29 LAB — CBC
HCT: 47.6 % (ref 39.0–52.0)
HEMOGLOBIN: 16 g/dL (ref 13.0–17.0)
MCH: 29.1 pg (ref 26.0–34.0)
MCHC: 33.6 g/dL (ref 30.0–36.0)
MCV: 86.7 fL (ref 78.0–100.0)
Platelets: 237 10*3/uL (ref 150–400)
RBC: 5.49 MIL/uL (ref 4.22–5.81)
RDW: 12.7 % (ref 11.5–15.5)
WBC: 8.1 10*3/uL (ref 4.0–10.5)

## 2017-12-29 LAB — BASIC METABOLIC PANEL
ANION GAP: 12 (ref 5–15)
BUN: 19 mg/dL (ref 6–20)
CALCIUM: 9.9 mg/dL (ref 8.9–10.3)
CHLORIDE: 102 mmol/L (ref 101–111)
CO2: 25 mmol/L (ref 22–32)
CREATININE: 0.99 mg/dL (ref 0.61–1.24)
GFR calc non Af Amer: >60 mL/min (ref >60)
Glucose, Bld: 164 mg/dL — ABNORMAL HIGH (ref 65–99)
Potassium: 4 mmol/L (ref 3.5–5.1)
SODIUM: 139 mmol/L (ref 135–145)

## 2017-12-29 LAB — I-STAT TROPONIN, ED
TROPONIN I, POC: 0 ng/mL (ref 0.00–0.08)
TROPONIN I, POC: 0.01 ng/mL (ref 0.00–0.08)

## 2017-12-29 LAB — D-DIMER, QUANTITATIVE (NOT AT ARMC): D DIMER QUANT: 0.38 ug{FEU}/mL (ref 0.00–0.50)

## 2017-12-29 MED ORDER — SODIUM CHLORIDE 0.9 % IV BOLUS (SEPSIS)
1000.0000 mL | Freq: Once | INTRAVENOUS | Status: AC
  Administered 2017-12-29: 1000 mL via INTRAVENOUS

## 2017-12-29 MED ORDER — GADOBENATE DIMEGLUMINE 529 MG/ML IV SOLN
20.0000 mL | Freq: Once | INTRAVENOUS | Status: AC | PRN
  Administered 2017-12-29: 20 mL via INTRAVENOUS

## 2017-12-29 NOTE — ED Notes (Signed)
Pt stable, ambulatory, states understanding of discharge instructions 

## 2017-12-29 NOTE — Discharge Instructions (Signed)
Your workup has been very reassuring in the ED.  No specific cause of your symptoms.  Make sure that you discuss with your primary care doctor that you need an outpatient cardiac ultrasound, A1c, lipid and further risk modifications.  Return to the ED with any worsening symptoms.

## 2017-12-29 NOTE — ED Provider Notes (Signed)
[facility] EMERGENCY DEPARTMENT Provider Note   CSN: [id number] Arrival date & time: 12/29/17  1003     History   Chief Complaint Chief Complaint  Patient presents with  . Dizziness    HPI Troy Garza is a 57 y.o. male.  HPI 57 year old Caucasian male past medical history significant for hypertension, hyperlipidemia, vertigo presents to the emergency department today for evaluation of acute onset of dizziness.  Patient states that approximately 630 this morning he was at work and had acute onset of dizziness.  The patient felt like "he was drunk walking".  States he has vertigo but this felt very different.  He also reports some numbness and tingling to his right arm and during the episode.  The episode lasted for approximately 30 minutes and self resolved.  Denies any symptoms at this time.  No history of same.  Patient states he has been having some substernal chest pain that does not radiate over the past few weeks.  Denies any chest pain or shortness of breath with episode today.  Patient denies any associated nausea, vomiting, diaphoresis.  He does have history of hypertension took his blood pressure medicine this morning.  He denies any associated vision changes.  Denies any vertigo-like symptoms.  Denies any syncope. Does have history of dvt last year. No currently anticoagulated. Denies any history of PE, prolonged immobilization, recent hospitalizations or surgeries, unilateral leg swelling or calf tenderness, hemoptysis, tobacco use.  Pt denies any fever, chill, ha, vision changes,  congestion, neck pain, , cough, abd pain, n/v/d, urinary symptoms, change in bowel habits, melena, hematochezia, lower extremity paresthesias. Past Medical History:  Diagnosis Date  . ED (erectile dysfunction)   . GERD (gastroesophageal reflux disease)    hx of  . Hearing loss in left ear   . Hyperlipidemia   . Hypertension   . Neuromuscular disorder ([place])    hiatal hernia  .  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 Family history of factor V Leiden mutation 07/24/2016  . History of tympanoplasty of left ear 09/03/2015  . ED (erectile dysfunction) 07/26/2012  . Hypertension 06/24/2012    Past Surgical History:  Procedure Laterality Date  . ear surgery Left   . HERNIA REPAIR  as baby  . LUMBAR DISC SURGERY  8 years ago  . UMBILICAL HERNIA REPAIR  08/12/11       Home Medications    Prior to Admission medications   Medication Sig Start Date End Date Taking? Authorizing Provider  Azilsartan-Chlorthalidone (EDARBYCLOR) 40-12.5 MG TABS Take 1 tablet by mouth daily. 02/17/17  Yes Denita Lung, MD  Multiple Vitamins-Minerals (MULTIVITAMIN WITH MINERALS) tablet Take 1 tablet by mouth daily.   Yes [provider]  sildenafil (REVATIO) 20 MG tablet Take 1-5 pills daily as needed 02/25/17  Yes Denita Lung, MD  vitamin C (ASCORBIC ACID) 500 MG tablet Take 500 mg by mouth daily.   Yes [provider]  amLODipine (NORVASC) 5 MG tablet Take 1 tablet (5 mg total) by mouth daily. Patient not taking: Reported on 12/29/2017 02/17/17   Denita Lung, MD  LORazepam (ATIVAN) 0.5 MG tablet TAKE 1 TABLET TWICE A DAY AS NEEDED FOR ANXIETY Patient not taking: Reported on 12/29/2017 09/28/17   Denita Lung, MD    Family History Family History  Problem Relation Age of Onset  .  Hypertension Father   . Colon polyps Father   . Heart disease Father   . Brain cancer Father   . Kidney cancer Father   . Bone cancer Father   . Liver cancer Father   . Colon cancer Neg Hx     Social History Social History   Tobacco Use  . Smoking status: Never Smoker  . Smokeless tobacco: Never Used  Substance Use Topics  . Alcohol use: Yes    Alcohol/week: 0.0 oz    Comment: socially  . Drug use: No     Allergies   Penicillins   Review of Systems Review of  Systems Constitutional: Negative for chills, diaphoresis and fever.  HENT: Negative for congestion.   Eyes: Negative for visual disturbance.  Respiratory: Positive for shortness of breath. Negative for cough.   Cardiovascular: Positive for chest pain. Negative for palpitations and leg swelling.  Gastrointestinal: Negative for abdominal pain, diarrhea, nausea and vomiting.  Genitourinary: Negative for dysuria, flank pain, frequency, hematuria and urgency.  Musculoskeletal: Negative for arthralgias and myalgias.  Skin: Negative for rash.  Neurological: Positive for dizziness, weakness, light-headedness and numbness. Negative for syncope and headaches.  Psychiatric/Behavioral: Negative for sleep disturbance. The patient is not nervous/anxious.    Physical Exam Updated Vital Signs BP 111/70   Pulse 73   Temp 98.5 F (36.9 C) (Oral)   Resp 17   SpO2 95%   Physical Exam Constitutional: He is oriented to person, place, and time. He appears well-developed and well-nourished.  Non-toxic appearance. No distress.  HENT:  Head: Normocephalic and atraumatic.  Nose: Nose normal.  Mouth/Throat: Oropharynx is clear and moist.  Eyes: Conjunctivae are normal. Pupils are equal, round, and reactive to light. Right eye exhibits no discharge. Left eye exhibits no discharge.  Neck: Normal range of motion. Neck supple. No JVD present. No tracheal deviation present.  Cardiovascular: Normal rate, regular rhythm, normal heart sounds and intact distal pulses.  Pulmonary/Chest: Effort normal and breath sounds normal. No respiratory distress. He exhibits no tenderness.  No hypoxia or tachypnea.  Abdominal: Soft. Bowel sounds are normal. He exhibits no distension. There is no tenderness. There is no rebound and no guarding.  Musculoskeletal: Normal range of motion.  No lower extremity edema or calf tenderness.  Lymphadenopathy:    He has no cervical adenopathy.  Neurological: He is alert and oriented to  person, place, and time.  The patient is alert, attentive, and oriented x 3. Speech is clear. Cranial nerve II-VII grossly intact. Negative pronator drift. Sensation intact. Strength 5/5 in all extremities. Reflexes 2+ and symmetric at biceps, triceps, knees, and ankles. Rapid alternating movement and fine finger movements intact. Romberg is absent. Posture and gait normal.   Skin: Skin is warm and dry. Capillary refill takes less than 2 seconds. He is not diaphoretic.  Psychiatric: His behavior is normal. Judgment and thought content normal.  Nursing note and vitals reviewed.  ED Treatments / Results  Labs (all labs ordered are listed, but only abnormal results are displayed) Labs Reviewed  BASIC METABOLIC PANEL - Abnormal; Notable for the following components:      Result Value   Glucose, Bld 164 (*)    All other components within normal limits  CBC  I-STAT TROPONIN, ED  I-STAT TROPONIN, ED    EKG  EKG Interpretation  Date/Time:  Tuesday December 29 2017 10:07:32 EST Ventricular Rate:  81 PR Interval:  146 QRS Duration: 86 QT Interval:  400 QTC Calculation: 464 R Axis:  26 Text Interpretation:  Normal sinus rhythm Septal infarct , age undetermined No significant change since last tracing Confirmed by Blanchie Dessert [phone]) on 12/29/2017 1:11:14 PM       Radiology Dg Chest 2 View  Result Date: 12/29/2017 CLINICAL DATA:  Right-sided tingling and numbness, generalized weakness, left-sided chest discomfort, and dizziness beginning around 6:30 this morning. EXAM: CHEST  2 VIEW COMPARISON:  Report of a chest x-ray of October 29, 2003. FINDINGS: The lungs are adequately inflated. There is no focal infiltrate. There is no pleural effusion. The heart is top-normal in size. The pulmonary vascularity is normal. The mediastinum is normal in width. The trachea is midline. The bony thorax exhibits no acute abnormality. IMPRESSION: There is no pneumonia, CHF, nor other acute  cardiopulmonary abnormality. Electronically Signed   By: David  Martinique M.D.   On: 12/29/2017 10:28   Ct Head Wo Contrast  Result Date: 12/29/2017 CLINICAL DATA:  Dizziness starting this morning. EXAM: CT HEAD WITHOUT CONTRAST TECHNIQUE: Contiguous axial images were obtained from the base of the skull through the vertex without intravenous contrast. COMPARISON:  None. FINDINGS: Brain: No evidence of acute infarction, hemorrhage, hydrocephalus, extra-axial collection or mass lesion/mass effect. Vascular: No hyperdense vessel or unexpected calcification. Skull: No evidence for fracture. No worrisome lytic or sclerotic lesion. Sinuses/Orbits: No acute finding. Other: None. IMPRESSION: 1. Normal CT evaluation of the brain. Electronically Signed   By: Misty Stanley M.D.   On: 12/29/2017 12:52    Procedures Procedures (including critical care time)  Medications Ordered in ED Medications  sodium chloride 0.9 % bolus 1,000 mL (0 mLs Intravenous Stopped 12/29/17 1426)     Initial Impression / Assessment and Plan / ED Course  I have reviewed the triage vital signs and the nursing notes.  Pertinent labs & imaging results that were available during my care of the patient were reviewed by me and considered in my medical decision making (see chart for details).     Patient presents to the ED for evaluation of acute onset of dizziness and right arm paresthesias.  This started this morning while at work at 630.  Patient also reports of intermittent chest pain or shortness of breath has been ongoing for the past 2 weeks but denies any with his episode of dizziness today.  Patient overall well-appearing and nontoxic.  Vital signs are reassuring in the ED.  Patient mildly hypertensive however this seems to be due to stress and anxiety of being in the ED.  Has resolved.  Patient is afebrile no tachycardia noted.  Lungs clear to auscultation bilaterally.  Heart regular rate and rhythm.  No focal neuro deficit on  exam.  Abdomen benign.  Patient remains asymptomatic at this time.  Lab work has been reassuring.  No leukocytosis noted.  Negative d-dimer.  Negative delta troponin.  Elect lites are reassuring.  Normal kidney function.  Chest x-ray was normal.  EKG shows no ischemic changes.  Chest pain does not appear to be cardiac in nature.  They are atypical for ACS.  Heart pathway score 2.  D-dimer was negative low suspicion for PE.  Clinical presentation not seem consistent with dissection.  I spoke with Dr. Max Fickle with neurology concerning patient.  He said that given the dizziness and right arm symptoms the patient would benefit from TIA workup and admission.  He did see patient in the ED and please see his note for details.  Patient then states that he does not want to be admitted.  Discussed with neurology.  They are comfortable with patient being discharged if his MRI and MRA are normal.  He can have his echocardiogram and TIA workup in the outpatient setting by primary care.  Discussed with patient that it is important for him to be admitted for TIA workup.  He states that he wants to be discharged if his MRI is normal.  Neurology is in agreement.  MRI MRI returned without any acute findings.  Patient has been a symptom medic while in the ED.  Vital signs are reassuring.  Stable for discharge at this time.  Pt is hemodynamically stable, in NAD, & able to ambulate in the ED. Evaluation does not show pathology that would require ongoing emergent intervention or inpatient treatment. I explained the diagnosis to the patient. Pain has been managed & has no complaints prior to dc. Pt is comfortable with above plan and is stable for discharge at this time. All questions were answered prior to disposition. Strict return precautions for f/u to the ED were discussed. Encouraged follow up with PCP.  Dicussed with Dr. Maryan Rued who is agreeable with the above plan.     Final Clinical Impressions(s) / ED Diagnoses    Final diagnoses:  Dizziness  Atypical chest pain    ED Discharge Orders    None       Aaron Edelman 12/29/17 2116    Blanchie Dessert, MD 12/29/17 2212

## 2017-12-29 NOTE — ED Notes (Signed)
Patient transported to MRI 

## 2017-12-29 NOTE — ED Notes (Signed)
MRI called to say transport is being sent

## 2017-12-29 NOTE — ED Triage Notes (Addendum)
Pt reports dizziness starting at 630 this morning. Pt state that he also has arm numbness and tingling in the right side. Pt reports generalized weakness as well. No neuro deficits noted in triage. Pt also reports some left sided chest pain.

## 2017-12-29 NOTE — ED Notes (Signed)
Patient transported to CT 

## 2017-12-29 NOTE — Consult Note (Signed)
Neurology Consultation Reason for Consult: Dizziness and right arm tingling Referring Physician: ED provider  CC: Dizziness and right arm tingling  History is obtained from: Patient and chart  HPI: Troy Garza is a 57 y.o. male with a PMHx of hypertension and erectile dysfunction who presented to the emergency department today with acute onset of feeling dizzy and heaviness in his legs and feet.  This coincided with a tingling sensation in his right arm from forearm into his fingers.  There was no focal weakness, slurred speech, facial droop, listing or leaning to either side, or visual changes.  He did not fall.  He had a slight pressure sensation located at the back of his head.  He reports that it felt similar to being drunk.  The initial episode lasted about 5-6 seconds and resolved with steadying himself against a railing.  There was no loss of consciousness.  Symptoms then returned and came in spells.  There were no known fumes, strange smells or environmental exposures.  He reported missing his dose of blood pressure medication over the weekend.  He also reported feeling left sided chest pain with radiation to his throat not provoked with exertion.  ROS: As per HPI.  Past Medical History:  Diagnosis Date  . ED (erectile dysfunction)   . GERD (gastroesophageal reflux disease)    hx of  . Hearing loss in left ear   . Hyperlipidemia   . Hypertension   . Neuromuscular disorder ([place])    hiatal hernia  . Vertigo    left ear drum burst as child and required multipkle surgeries     Family History  Problem Relation Age of Onset  . Hypertension Father   . Colon polyps Father   . Heart disease Father   . Brain cancer Father   . Kidney cancer Father   . Bone cancer Father   . Liver cancer Father   . Colon cancer Neg Hx      Social History:  reports that  has never smoked. he has never used smokeless tobacco. He reports that he drinks alcohol. He reports that he does not use  drugs.   Exam: Current vital signs: BP (!) 170/105   Pulse 70   Temp 98.5 F (36.9 C) (Oral)   Resp 14   SpO2 97%  Vital signs in last 24 hours: Temp:  [98.5 F (36.9 C)] 98.5 F (36.9 C) (02/12 1010) Pulse Rate:  [64-76] 70 (02/12 1430) Resp:  [11-18] 14 (02/12 1430) BP: (143-170)/(85-111) 170/105 (02/12 1430) SpO2:  [94 %-98 %] 97 % (02/12 1430)   Physical Exam  Constitutional: Appears well-developed and well-nourished.  He is resting in bed in no acute distress.  Psych: Affect appropriate to situation Eyes: No scleral injection Head: Normocephalic, atraumatic.  Neck: Normal ROM without meningmus  Respiratory: Normal effort breathing on ambient air Skin: Intact with out obvious rashes  Neuro: Mental Status: Patient is awake and alert.  He has no difficulty answering complex questions and commands.  He has no signs of aphasia or dysarthria. Patient is able to give a clear and coherent history. Cranial Nerves: II: Visual Fields are full. Pupils are equal, round, and reactive to light.   III,IV, VI: EOMI, no extinction DSS, no nystagmus V: Facial sensation is symmetric to temperature VII: Facial movement is symmetric without drooping.  VIII: hearing is intact to voice X: Palette elevates symmetrically XI: Shoulder shrug is symmetric. XII: tongue is midline. Motor: Tone is normal. Bulk is normal.  No rigidity. Strength was 5/5 in the upper extremities and lower extremities. Sensory: Sensation is symmetric to light touch and temperature in the arms and legs. Deep Tendon Reflexes: 2+ and symmetric in the Achilles and Patellae 2+ and symmetric in the Triceps and Brachioradialis Plantars: Toes are downgoing bilaterally.  Cerebellar: FNF and HKS are intact bilaterally Gait: Normal gait and station, normal base, normal ability to walk on heels and toes, normal tandem gait.   I have reviewed labs in epic and the results pertinent to this consultation are: Normal renal  function Glucose 164 CBC within normal limits Troponin negative x 2  I have reviewed the images obtained: Chest x-ray negative CT head negative  Impression:  57 yo man with history of hypertension and erectile dysfunction with acute onset of dizziness, lower extremity heaviness, and right forearm and hand tingling with subsequent resolution of symptoms after multiple spells lasting a few seconds at a time. Of note, his blood pressure has been elevated with systolic pressures ranging from 150 to the 170's.  1. CT head was negative for acute intracranial abnormality.   2. Most likely component of the DDx is felt to be hypertensive encephalopathy. Also a consideration would be TIA.   Recommendations: 1) MRI Brain w/o contrast 2) MRA Head and Neck 3) If above negative for acute event, further TIA workup can be completed as outpatient including A1c, Lipids, TTE and any further risk factor modifications. 4) Risk/benefit profile given his overall presentation favors aggressive BP management over permissive HTN protocol. Although stroke is felt to be unlikely, if stroke is seen on MRI, then switch to permissive HTN.   Jule Ser, PGY3 12/29/2017, 3:27 PM  I have seen and examined the patient. Impression and Recommendations discussed with Jule Ser, PGY3 Neurology Resident.  Electronically signed: Dr. Kerney Elbe

## 2018-01-14 NOTE — Telephone Encounter (Signed)
Was unable to complete over the phone, had to go to [facility] website and print form to complete, this was completed and faxed in

## 2018-01-15 NOTE — Telephone Encounter (Signed)
Additional information was requested and chart notes were faxed

## 2018-03-08 ENCOUNTER — Ambulatory Visit (INDEPENDENT_AMBULATORY_CARE_PROVIDER_SITE_OTHER): Payer: BLUE CROSS/BLUE SHIELD | Admitting: Family Medicine

## 2018-03-08 ENCOUNTER — Encounter: Payer: Self-pay | Admitting: Family Medicine

## 2018-03-08 VITALS — BP 138/84 | HR 86 | Temp 98.0°F | Ht 67.0 in | Wt 204.2 lb

## 2018-03-08 DIAGNOSIS — I10 Essential (primary) hypertension: Secondary | ICD-10-CM | POA: Diagnosis not present

## 2018-03-08 DIAGNOSIS — N529 Male erectile dysfunction, unspecified: Secondary | ICD-10-CM | POA: Diagnosis not present

## 2018-03-08 DIAGNOSIS — R0789 Other chest pain: Secondary | ICD-10-CM | POA: Diagnosis not present

## 2018-03-08 MED ORDER — VARDENAFIL HCL 20 MG PO TABS: 20.0000 mg | ORAL_TABLET | Freq: Every day | ORAL | 3 refills | Status: DC | PRN

## 2018-03-08 NOTE — Patient Instructions (Signed)
Check into the DASH diet to help with your blood pressure

## 2018-03-08 NOTE — Progress Notes (Signed)
   Subjective:    Patient ID: Troy Garza, male    DOB: [birth year], 57 y.o.   MRN: [id number]  HPI He is here to discuss multiple concerns.  He does have difficulty with erectile dysfunction and has tried Viagra as well as Cialis in the past but they have not been effective.  Levitra apparently does work quite well. He also complains of a chest burning sensation in the notes with exertion especially when he is on a treadmill.  He also notes an increased heart rate with that.  There is no associated shortness of breath, diaphoresis, weakness or arm pain.  No family history of heart disease.  He does not smoke   Review of Systems     Objective:   Physical Exam Alert and in no distress. Tympanic membranes and canals are normal. Pharyngeal area is normal. Neck is supple without adenopathy or thyromegaly. Cardiac exam shows a regular sinus rhythm without murmurs or gallops. Lungs are clear to auscultation. EKG does show evidence of LVH. Blood work was reviewed and lipid panel will be ordered.      Assessment & Plan:  Erectile dysfunction, unspecified erectile dysfunction type - Plan: vardenafil (LEVITRA) 20 MG tablet  Burning in the chest - Plan: EKG 12-Lead, Lipid panel, Ambulatory referral to Cardiology  Essential hypertension I will send him for cardiology evaluation.  His blood pressure is not quite at control.  Did recommend the DASH diet.  May need to readjust his medications depending upon further cardiology evaluation.

## 2018-03-09 ENCOUNTER — Other Ambulatory Visit: Payer: Self-pay

## 2018-03-09 DIAGNOSIS — R0789 Other chest pain: Secondary | ICD-10-CM

## 2018-03-09 DIAGNOSIS — R0600 Dyspnea, unspecified: Secondary | ICD-10-CM

## 2018-03-09 LAB — LIPID PANEL
CHOL/HDL RATIO: 4.4 ratio (ref 0.0–5.0)
Cholesterol, Total: 249 mg/dL — ABNORMAL HIGH (ref 100–199)
HDL: 56 mg/dL (ref >39)
LDL CALC: 167 mg/dL — AB (ref 0–99)
Triglycerides: 129 mg/dL (ref 0–149)
VLDL Cholesterol Cal: 26 mg/dL (ref 5–40)

## 2018-03-12 ENCOUNTER — Other Ambulatory Visit: Payer: Self-pay | Admitting: Family Medicine

## 2018-03-12 DIAGNOSIS — I10 Essential (primary) hypertension: Secondary | ICD-10-CM

## 2018-03-12 NOTE — Telephone Encounter (Signed)
Called [employer] t# [phone] regarding P.A. And was approved for year.  They will refax approval letter, faxed pharmacy

## 2018-03-15 ENCOUNTER — Other Ambulatory Visit: Payer: Self-pay | Admitting: Family Medicine

## 2018-03-16 NOTE — Telephone Encounter (Signed)
[facility] is requesting to fill pt sildenail. Marland Kitchen Please advise [facility]

## 2018-04-14 ENCOUNTER — Ambulatory Visit (INDEPENDENT_AMBULATORY_CARE_PROVIDER_SITE_OTHER): Payer: BLUE CROSS/BLUE SHIELD | Admitting: Family Medicine

## 2018-04-14 ENCOUNTER — Encounter: Payer: Self-pay | Admitting: Family Medicine

## 2018-04-14 VITALS — BP 174/98 | HR 92 | Temp 98.1°F | Wt 210.4 lb

## 2018-04-14 DIAGNOSIS — E291 Testicular hypofunction: Secondary | ICD-10-CM

## 2018-04-14 DIAGNOSIS — K219 Gastro-esophageal reflux disease without esophagitis: Secondary | ICD-10-CM

## 2018-04-14 MED ORDER — TESTOSTERONE 20.25 MG/ACT (1.62%) TD GEL: 2.0000 | Freq: Every day | TRANSDERMAL | 5 refills | Status: DC

## 2018-04-14 NOTE — Progress Notes (Addendum)
HPI He presents with a one year history of an intermittant burning sensation and pressure in his mid central chest. Worse over the past 2 months. Worse on exertion and with fried foods. States he also has an intermittent cough that is productive of clear sputum and gets a choking sensation after eating.Has tried pepto-bismol and medications and cutting out citrus foods with some relief. Belching relieves the pain. No coughing or burning at night.  Denies shortness of breath, n/v/d, fevers, chills, headaches or weakness.  He also would like a refill on his testosterone.  He can get the generic version for a lot less cost.  O  Alert and in no distress. Tympanic membranes and canals are normal. Pharyngeal area is normal. Neck is supple without adenopathy or thyromegaly. Cardiac exam shows a regular sinus rhythm without murmurs or gallops. Lungs are clear to auscultation.   A   GERD       Hypogonadism    P testosterone called in.  He is to return here in 1 month for follow-up.     We will also have him use Prilosec or Nexium double dosing for the next 2 weeks and let me know how this is doing to help his reflux type symptoms

## 2018-04-17 ENCOUNTER — Other Ambulatory Visit: Payer: Self-pay | Admitting: Family Medicine

## 2018-04-17 DIAGNOSIS — I10 Essential (primary) hypertension: Secondary | ICD-10-CM

## 2018-04-26 ENCOUNTER — Telehealth: Payer: Self-pay | Admitting: Family Medicine

## 2018-04-26 NOTE — Telephone Encounter (Signed)
Pt left message [employer] needs P.A.  Called [employer] T# [phone], can only do by fax, they will fax form.  Left message for pt

## 2018-04-28 NOTE — Telephone Encounter (Signed)
Form completed and faxed. 

## 2018-05-12 ENCOUNTER — Other Ambulatory Visit: Payer: Self-pay | Admitting: Family Medicine

## 2018-05-12 DIAGNOSIS — I10 Essential (primary) hypertension: Secondary | ICD-10-CM

## 2018-05-12 NOTE — Telephone Encounter (Signed)
P.A. Approved til 04/29/19, pt informed, went thru pharmacy for $0

## 2018-05-14 ENCOUNTER — Ambulatory Visit: Payer: BLUE CROSS/BLUE SHIELD | Admitting: Internal Medicine

## 2018-05-17 ENCOUNTER — Ambulatory Visit: Payer: Self-pay | Admitting: Family Medicine

## 2018-06-26 IMAGING — CT CT HEAD W/O CM
3 series · 15 of 47 positions shown, 18 images · non-contrast
Comparison: None.

CLINICAL DATA: Dizziness starting this morning.

EXAM:
CT HEAD WITHOUT CONTRAST
TECHNIQUE: Contiguous axial images were obtained from the base of the skull
through the vertex without intravenous contrast.

[Series 3: head 5.0 h30s · axial · 0.42mm/px · z∈[-74,+61]mm · 9 of 33 slices shown, 12 images]
[im 3/33  brain]
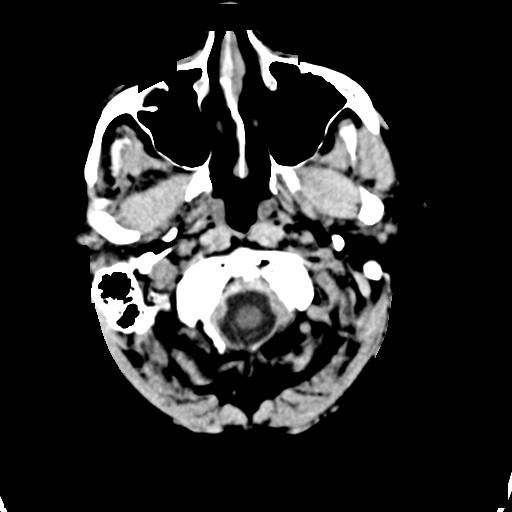
[im 3/33  bone]
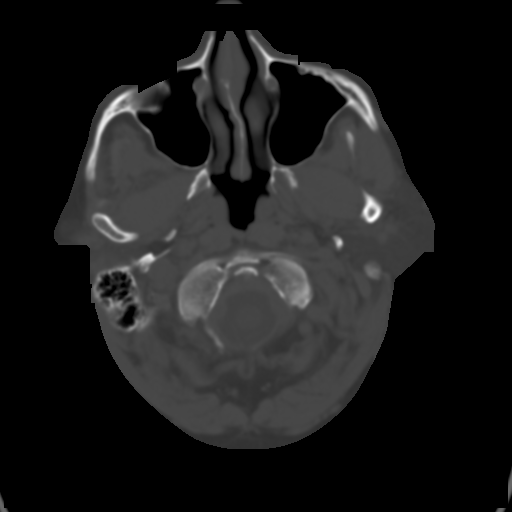
[im 6/33  brain]
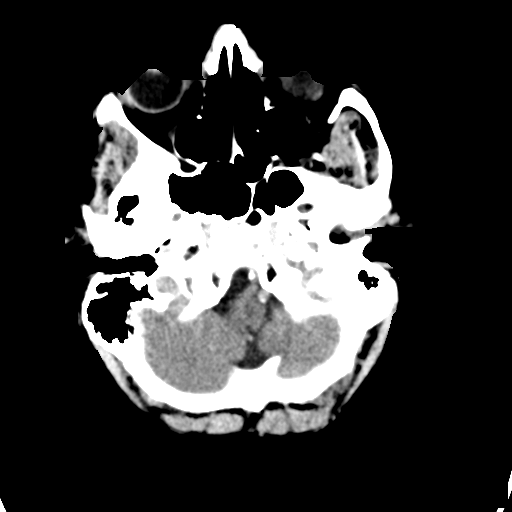
[im 9/33  brain]
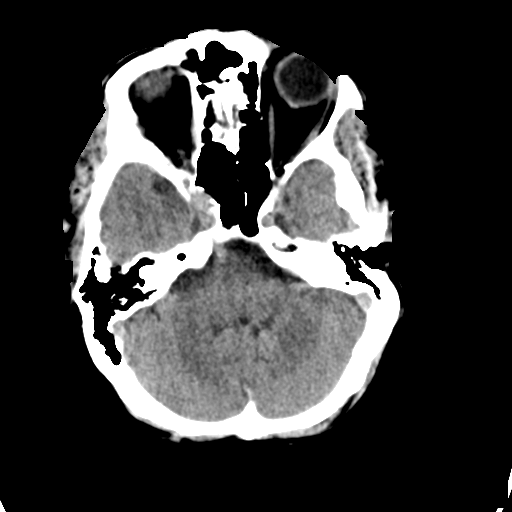
[im 13/33  brain]
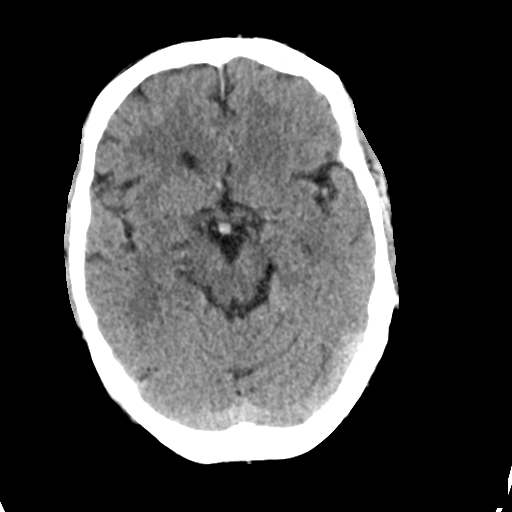
[im 17/33  brain]
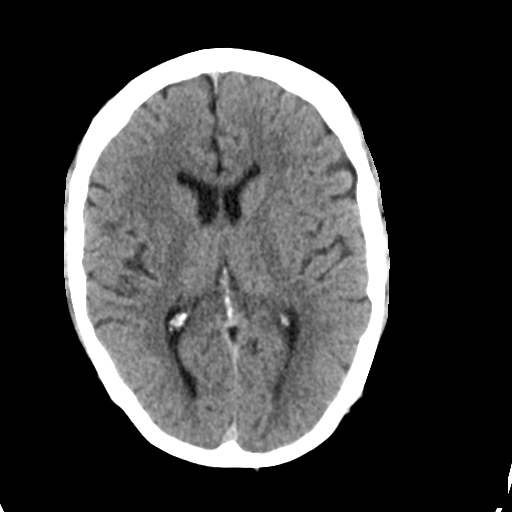
[im 17/33  bone]
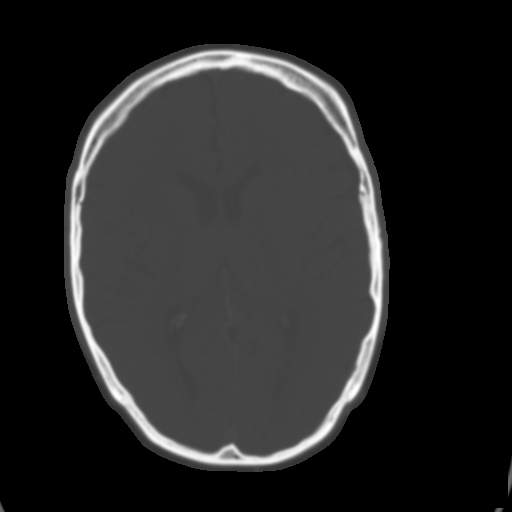
[im 20/33  brain]
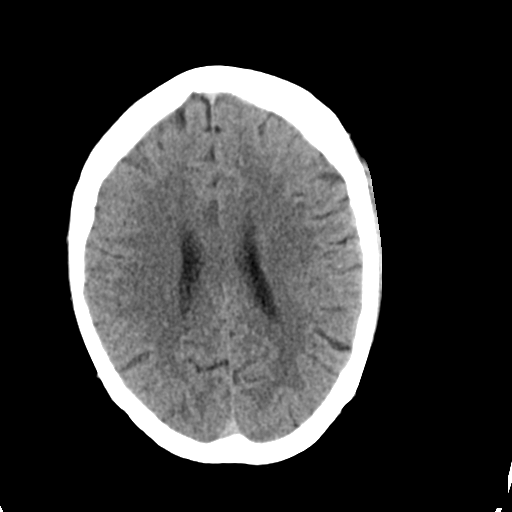
[im 24/33  brain]
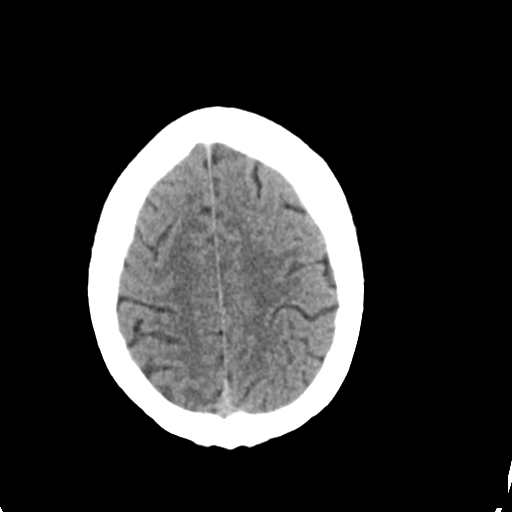
[im 27/33  brain]
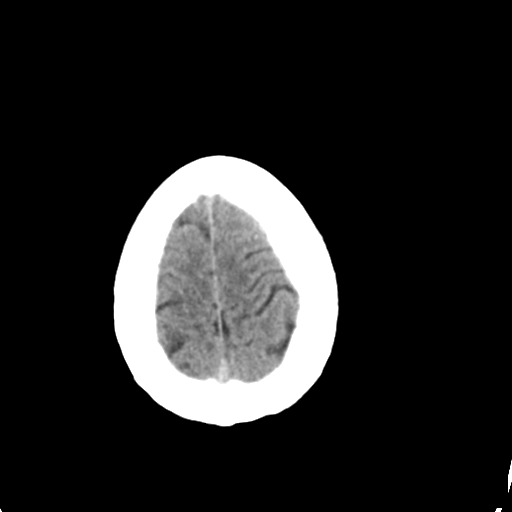
[im 30/33  brain]
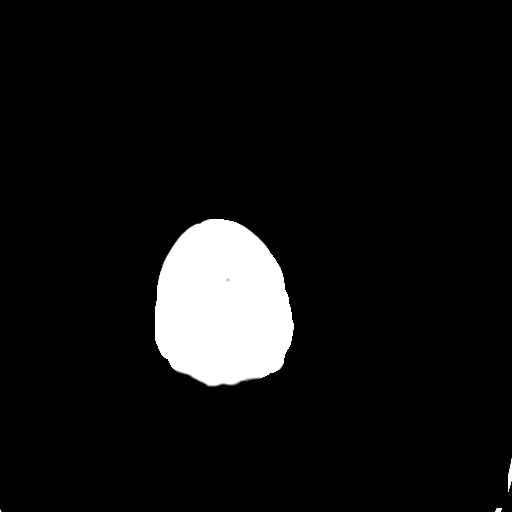
[im 30/33  bone]
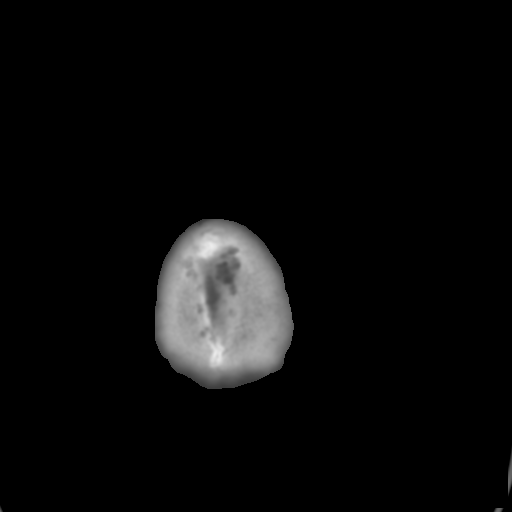

[Series 5: head 3.0 mpr cor · coronal · 0.31mm/px · 3 of 67 slices shown]
[im 23/67  brain]
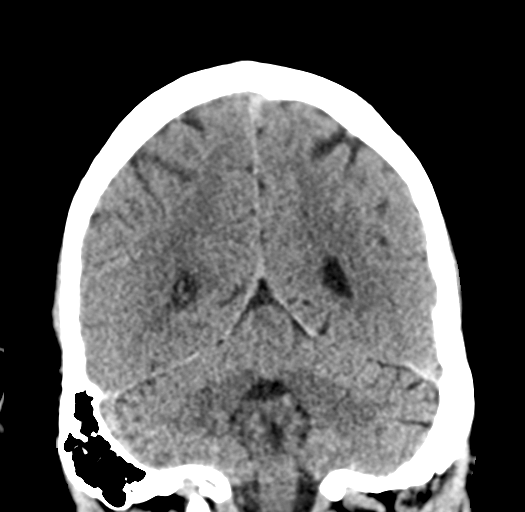
[im 30/67  brain]
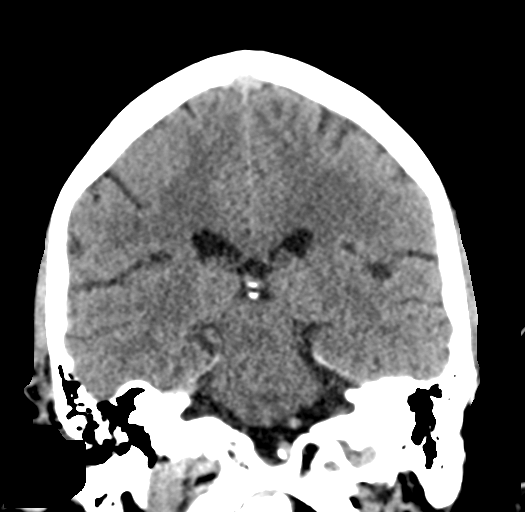
[im 37/67  brain]
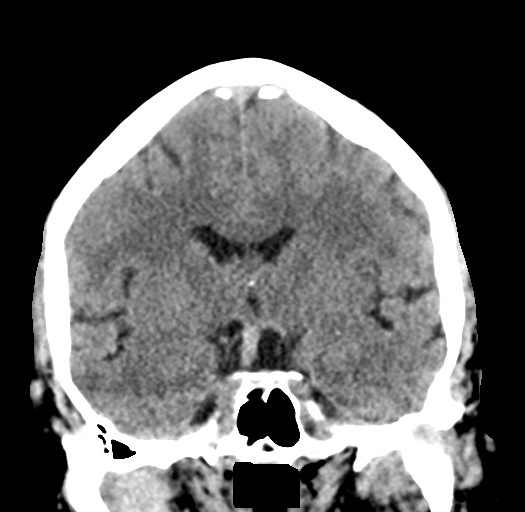

[Series 6: head 3.0 mpr sag · sagittal · 0.31mm/px · 3 of 57 slices shown]
[im 19/57  brain]
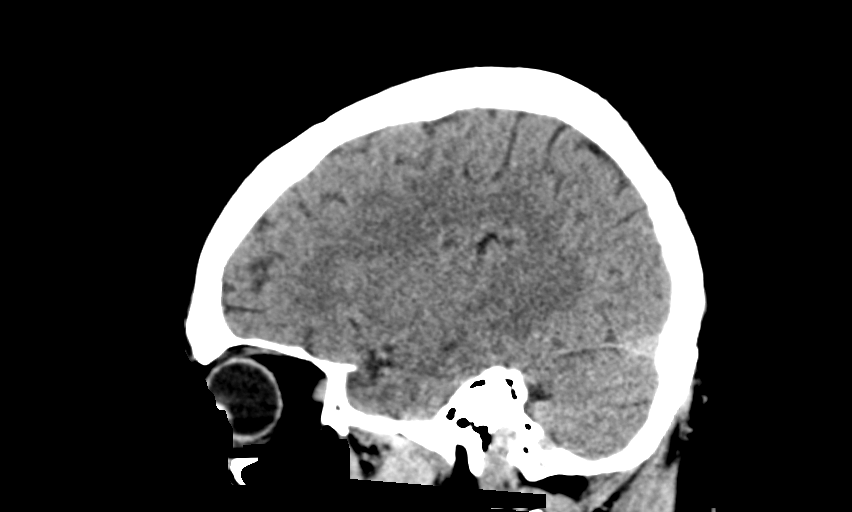
[im 29/57  brain]
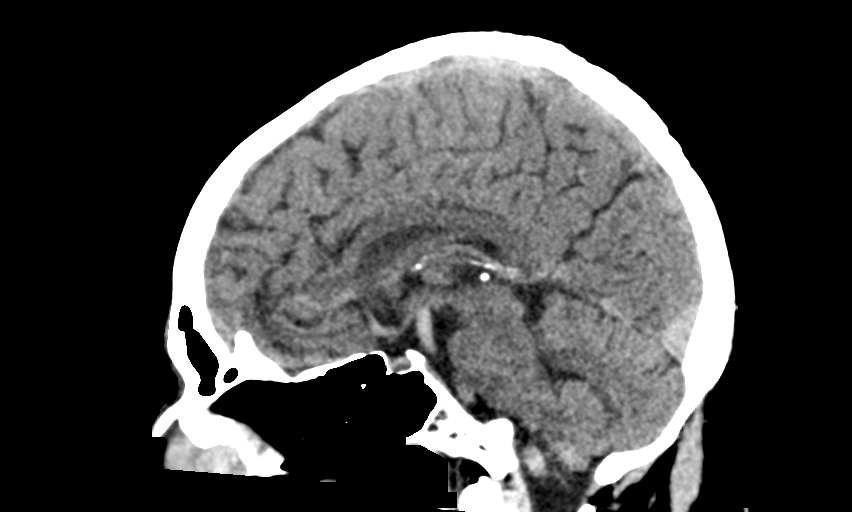
[im 38/57  brain]
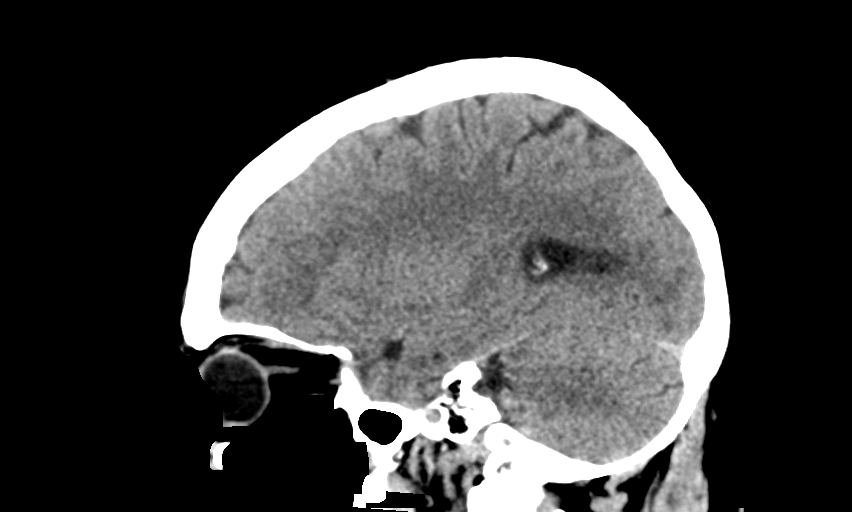

[15 of 47 positions shown; findings below may reference images not displayed]

FINDINGS: Brain: No evidence of acute infarction, hemorrhage, hydrocephalus,
extra-axial collection or mass lesion/mass effect.

Vascular: No hyperdense vessel or unexpected calcification.

Skull: No evidence for fracture. No worrisome lytic or sclerotic
lesion.

Sinuses/Orbits: No acute finding.

Other: None.
IMPRESSION: 1. Normal CT evaluation of the brain.

## 2018-07-04 NOTE — Progress Notes (Deleted)
Cardiology Office Note   Date:  07/04/2018   ID:  Troy Garza, DOB Oct 06, 1961, MRN [id number]  PCP:  Denita Lung, MD  Cardiologist:   Troy Rouge, MD   No chief complaint on file.     History of Present Illness: Troy Garza is a 57 y.o. male who presents for consultation regarding chest pain. Referred by Dr Redmond School CRF;s include HTN and HLD  Describes one year history of intermittent burning and pressure in mid chest. Worse with exertion and fried foods Lots of GI overtones with choking sensation after eating some improvement with pepto bismol and belching  He is a non smoker and no family history of premature CAD   Takes androgel and has ED BP not ideally controlled     Past Medical History:  Diagnosis Date  . ED (erectile dysfunction)   . GERD (gastroesophageal reflux disease)    hx of  . Hearing loss in left ear   . Hyperlipidemia   . Hypertension   . Neuromuscular disorder ([place])    hiatal hernia  . Vertigo    left ear drum burst as child and required multipkle surgeries    Past Surgical History:  Procedure Laterality Date  . ear surgery Left   . HERNIA REPAIR  as baby  . LUMBAR DISC SURGERY  8 years ago  . UMBILICAL HERNIA REPAIR  08/12/11     Current Outpatient Medications  Medication Sig Dispense Refill  . amLODipine (NORVASC) 5 MG tablet Take 1 tablet (5 mg total) by mouth daily. (Patient not taking: Reported on 04/14/2018) 90 tablet 3  . EDARBYCLOR 40-12.5 MG TABS TAKE 1 TABLET ONCE DAILY. 30 tablet 0  . LORazepam (ATIVAN) 0.5 MG tablet TAKE 1 TABLET TWICE A DAY AS NEEDED FOR ANXIETY (Patient not taking: Reported on 12/29/2017) 30 tablet 0  . Multiple Vitamins-Minerals (MULTIVITAMIN WITH MINERALS) tablet Take 1 tablet by mouth daily.    . sildenafil (REVATIO) 20 MG tablet TAKE 1-5 TABLETS ONCE DAILY AS NEEDED. (Patient not taking: Reported on 04/14/2018) 50 tablet 5  . Testosterone (ANDROGEL PUMP) 20.25 MG/ACT (1.62%) GEL Place 2 Pump onto the skin  daily. 75 g 5  . vardenafil (LEVITRA) 20 MG tablet Take 1 tablet (20 mg total) by mouth daily as needed for erectile dysfunction. 40 tablet 3  . vitamin C (ASCORBIC ACID) 500 MG tablet Take 500 mg by mouth daily.     No current facility-administered medications for this visit.     Allergies:   Penicillins    Social History:  The patient  reports that he has never smoked. He has never used smokeless tobacco. He reports that he drinks alcohol. He reports that he does not use drugs.   Family History:  The patient's family history includes Bone cancer in his father; Brain cancer in his father; Colon polyps in his father; Heart disease in his father; Hypertension in his father; Kidney cancer in his father; Liver cancer in his father.    ROS:  Please see the history of present illness.   Otherwise, review of systems are positive for none.   All other systems are reviewed and negative.    PHYSICAL EXAM: VS:  There were no vitals taken for this visit. , BMI There is no height or weight on file to calculate BMI. Affect appropriate Healthy:  appears stated age 55: normal Neck supple with no adenopathy JVP normal no bruits no thyromegaly Lungs clear with no wheezing and good diaphragmatic motion  Heart:  S1/S2 no murmur, no rub, gallop or click PMI normal Abdomen: benighn, BS positve, no tenderness, no AAA no bruit.  No HSM or HJR Distal pulses intact with no bruits No edema Neuro non-focal Skin warm and dry No muscular weakness    EKG:  03/15/18 SR rate 78 borderline LVH otherwise normal ECG    Recent Labs: 12/29/2017: BUN 19; Creatinine, Ser 0.99; Hemoglobin 16.0; Platelets 237; Potassium 4.0; Sodium 139    Lipid Panel    Component Value Date/Time   CHOL 249 (H) 03/08/2018 1459   TRIG 129 03/08/2018 1459   HDL 56 03/08/2018 1459   CHOLHDL 4.4 03/08/2018 1459   CHOLHDL 4.7 09/03/2015 0001   VLDL 27 09/03/2015 0001   LDLCALC 167 (H) 03/08/2018 1459      Wt Readings from  Last 3 Encounters:  04/14/18 210 lb 6.4 oz (95.4 kg)  03/08/18 204 lb 3.2 oz (92.6 kg)  09/15/17 203 lb (92.1 kg)      Other studies Reviewed: Additional studies/ records that were reviewed today include: Notes form NP and primary ECG labs .    ASSESSMENT AND PLAN:  1.  Chest Pain 2. HTN:  *** 3. HLD:   4. ED 5. Low T *** 6. GERD:  ***   Current medicines are reviewed at length with the patient today.  The patient does not have concerns regarding medicines.  The following changes have been made:  ***  Labs/ tests ordered today include: *** No orders of the defined types were placed in this encounter.    Disposition:   FU with cardiology PRN      Signed, Troy Rouge, MD  07/04/2018 3:06 PM    [facility] [address], [place], [place]  [address] Phone: [phone]; Fax: [phone]

## 2018-07-05 ENCOUNTER — Ambulatory Visit: Payer: Self-pay | Admitting: Cardiovascular Disease

## 2018-07-06 ENCOUNTER — Encounter: Payer: Self-pay | Admitting: Cardiovascular Disease

## 2018-07-10 ENCOUNTER — Other Ambulatory Visit: Payer: Self-pay | Admitting: Family Medicine

## 2018-07-10 DIAGNOSIS — I10 Essential (primary) hypertension: Secondary | ICD-10-CM

## 2018-08-17 ENCOUNTER — Other Ambulatory Visit: Payer: Self-pay | Admitting: Family Medicine

## 2018-08-17 DIAGNOSIS — I10 Essential (primary) hypertension: Secondary | ICD-10-CM

## 2019-02-15 DIAGNOSIS — N5201 Erectile dysfunction due to arterial insufficiency: Secondary | ICD-10-CM | POA: Diagnosis not present

## 2019-02-15 DIAGNOSIS — Z125 Encounter for screening for malignant neoplasm of prostate: Secondary | ICD-10-CM | POA: Diagnosis not present

## 2019-02-15 DIAGNOSIS — R3121 Asymptomatic microscopic hematuria: Secondary | ICD-10-CM | POA: Diagnosis not present

## 2019-03-24 ENCOUNTER — Telehealth: Payer: Self-pay

## 2019-03-24 NOTE — Telephone Encounter (Signed)
Called pt to advise he needs an CPE and repeat cologuard. Pt last CPE was in 2016 and last office visit was 04/14/2018. No answer LVM. [place]

## 2019-03-30 ENCOUNTER — Other Ambulatory Visit: Payer: Self-pay

## 2019-03-30 ENCOUNTER — Telehealth: Payer: Self-pay | Admitting: Family Medicine

## 2019-03-30 ENCOUNTER — Other Ambulatory Visit: Payer: Self-pay | Admitting: Family Medicine

## 2019-03-30 DIAGNOSIS — I10 Essential (primary) hypertension: Secondary | ICD-10-CM

## 2019-03-30 MED ORDER — AZILSARTAN-CHLORTHALIDONE 40-12.5 MG PO TABS: 1.0000 | ORAL_TABLET | Freq: Every day | ORAL | 0 refills | Status: DC

## 2019-03-30 NOTE — Telephone Encounter (Signed)
Pt was advised a couple days ago that he needed to schedule CPE. I will deny med until he schedules and then we can refill until his appt if needed

## 2019-03-30 NOTE — Telephone Encounter (Signed)
#  30 refilled sent to his pharmacy.

## 2019-03-30 NOTE — Telephone Encounter (Signed)
Needs refill on bp meds ( edarbyclor 40-12.5)  Pt scheduled med check for 5/26

## 2019-04-04 ENCOUNTER — Other Ambulatory Visit: Payer: Self-pay | Admitting: Family Medicine

## 2019-04-04 DIAGNOSIS — I10 Essential (primary) hypertension: Secondary | ICD-10-CM

## 2019-04-12 ENCOUNTER — Encounter: Payer: BLUE CROSS/BLUE SHIELD | Admitting: Family Medicine

## 2019-04-20 ENCOUNTER — Telehealth: Payer: Self-pay | Admitting: Family Medicine

## 2019-04-20 DIAGNOSIS — I10 Essential (primary) hypertension: Secondary | ICD-10-CM

## 2019-04-20 NOTE — Telephone Encounter (Signed)
P.A. EDARBYCLOR  

## 2019-04-21 ENCOUNTER — Encounter: Payer: BLUE CROSS/BLUE SHIELD | Admitting: Family Medicine

## 2019-04-29 ENCOUNTER — Other Ambulatory Visit: Payer: Self-pay

## 2019-04-29 ENCOUNTER — Encounter: Payer: Self-pay | Admitting: Family Medicine

## 2019-04-29 ENCOUNTER — Ambulatory Visit (INDEPENDENT_AMBULATORY_CARE_PROVIDER_SITE_OTHER): Payer: 59 | Admitting: Family Medicine

## 2019-04-29 VITALS — BP 160/102 | HR 94 | Temp 98.2°F | Wt 212.2 lb

## 2019-04-29 DIAGNOSIS — I209 Angina pectoris, unspecified: Secondary | ICD-10-CM

## 2019-04-29 DIAGNOSIS — N529 Male erectile dysfunction, unspecified: Secondary | ICD-10-CM | POA: Diagnosis not present

## 2019-04-29 DIAGNOSIS — E291 Testicular hypofunction: Secondary | ICD-10-CM

## 2019-04-29 DIAGNOSIS — I10 Essential (primary) hypertension: Secondary | ICD-10-CM

## 2019-04-29 MED ORDER — NITROGLYCERIN 0.4 MG SL SUBL: 0.4000 mg | SUBLINGUAL_TABLET | SUBLINGUAL | 3 refills | Status: DC | PRN

## 2019-04-29 MED ORDER — AMLODIPINE BESYLATE 5 MG PO TABS: 5.0000 mg | ORAL_TABLET | Freq: Every day | ORAL | 3 refills | Status: DC

## 2019-04-29 NOTE — Progress Notes (Addendum)
   Subjective:    Patient ID: Troy Garza, male    DOB: May 13, 1961, 58 y.o.   MRN: [id number]  HPI He is here for recheck after 1 year hiatus.  He did lose his insurance and now is back on it.  He recently started back taking Edarbychlor.  He is also has an underlying history of ED .  He states that over the last 8 months he has noted chest tightness, shortness of breath, diaphoresis with increased physical activity.  Usually the symptoms would go away in less than 10 minutes.  He states his symptoms have not been getting worse over this timeframe but been fairly stable.  There is no family history of heart disease.  He does not smoke.   Review of Systems     Objective:   Physical Exam Alert and in no distress. Tympanic membranes and canals are normal. Pharyngeal area is normal. Neck is supple without adenopathy or thyromegaly. Cardiac exam shows a regular sinus rhythm without murmurs or gallops. Lungs are clear to auscultation. EKG does show nonspecific ST-T changes       Assessment & Plan:  New-onset angina (HCC) - Plan: CBC with Differential/Platelet, Comprehensive metabolic panel, Lipid panel, Ambulatory referral to Cardiology, nitroGLYCERIN (NITROSTAT) 0.4 MG SL tablet, instructed him on proper use of nitroglycerin explaining if he needs to take it more than twice, he should go to the emergency room.  Essential hypertension - Plan: CBC with Differential/Platelet, Comprehensive metabolic panel, Lipid panel, amLODipine (NORVASC) 5 MG tablet, blood pressure is not under control and will therefore add Norvasc back to his regimen.  Male hypogonadism - Plan: Testosterone, .  Erectile dysfunction, unspecified erectile dysfunction type - Plan: Instructed to not use any ED medication for the present time. 6/13 the testosterone level is low.  I will place him on AndroGel.  He will need to be rechecked in 2 months.

## 2019-04-30 LAB — COMPREHENSIVE METABOLIC PANEL
ALT: 75 IU/L — ABNORMAL HIGH (ref 0–44)
AST: 35 IU/L (ref 0–40)
Albumin/Globulin Ratio: 2 (ref 1.2–2.2)
Albumin: 4.9 g/dL (ref 3.8–4.9)
Alkaline Phosphatase: 83 IU/L (ref 39–117)
BUN/Creatinine Ratio: 20 (ref 9–20)
BUN: 19 mg/dL (ref 6–24)
Bilirubin Total: 0.5 mg/dL (ref 0.0–1.2)
CO2: 23 mmol/L (ref 20–29)
Calcium: 10.5 mg/dL — ABNORMAL HIGH (ref 8.7–10.2)
Chloride: 104 mmol/L (ref 96–106)
Creatinine, Ser: 0.97 mg/dL (ref 0.76–1.27)
GFR calc Af Amer: 100 mL/min/{1.73_m2} (ref >59)
GFR calc non Af Amer: 87 mL/min/{1.73_m2} (ref >59)
Globulin, Total: 2.4 g/dL (ref 1.5–4.5)
Glucose: 121 mg/dL — ABNORMAL HIGH (ref 65–99)
Potassium: 3.9 mmol/L (ref 3.5–5.2)
Sodium: 142 mmol/L (ref 134–144)
Total Protein: 7.3 g/dL (ref 6.0–8.5)

## 2019-04-30 LAB — LIPID PANEL
Chol/HDL Ratio: 4.9 ratio (ref 0.0–5.0)
Cholesterol, Total: 227 mg/dL — ABNORMAL HIGH (ref 100–199)
HDL: 46 mg/dL (ref >39)
LDL Calculated: 145 mg/dL — ABNORMAL HIGH (ref 0–99)
Triglycerides: 178 mg/dL — ABNORMAL HIGH (ref 0–149)
VLDL Cholesterol Cal: 36 mg/dL (ref 5–40)

## 2019-04-30 LAB — TESTOSTERONE: Testosterone: 220 ng/dL — ABNORMAL LOW (ref 264–916)

## 2019-04-30 LAB — CBC WITH DIFFERENTIAL/PLATELET
Basophils Absolute: 0.1 10*3/uL (ref 0.0–0.2)
Basos: 1 %
EOS (ABSOLUTE): 0.6 10*3/uL — ABNORMAL HIGH (ref 0.0–0.4)
Eos: 8 %
Hematocrit: 48.8 % (ref 37.5–51.0)
Hemoglobin: 16.4 g/dL (ref 13.0–17.7)
Immature Grans (Abs): 0.1 10*3/uL (ref 0.0–0.1)
Immature Granulocytes: 1 %
Lymphocytes Absolute: 1.9 10*3/uL (ref 0.7–3.1)
Lymphs: 24 %
MCH: 28.6 pg (ref 26.6–33.0)
MCHC: 33.6 g/dL (ref 31.5–35.7)
MCV: 85 fL (ref 79–97)
Monocytes Absolute: 0.7 10*3/uL (ref 0.1–0.9)
Monocytes: 8 %
Neutrophils Absolute: 4.8 10*3/uL (ref 1.4–7.0)
Neutrophils: 58 %
Platelets: 234 10*3/uL (ref 150–450)
RBC: 5.73 x10E6/uL (ref 4.14–5.80)
RDW: 13.2 % (ref 11.6–15.4)
WBC: 8.2 10*3/uL (ref 3.4–10.8)

## 2019-04-30 MED ORDER — TESTOSTERONE 20.25 MG/ACT (1.62%) TD GEL: 2.0000 | Freq: Every day | TRANSDERMAL | 5 refills | Status: DC

## 2019-04-30 NOTE — Addendum Note (Signed)
Addended by: Denita Lung on: 04/30/2019 08:47 AM   Modules accepted: Orders

## 2019-05-01 ENCOUNTER — Telehealth: Payer: Self-pay | Admitting: Family Medicine

## 2019-05-01 NOTE — Telephone Encounter (Signed)
P.A. TESTOSTERONE °

## 2019-05-02 ENCOUNTER — Encounter: Payer: Self-pay | Admitting: Family Medicine

## 2019-05-02 NOTE — Addendum Note (Signed)
Addended by: Denita Lung on: 05/02/2019 04:19 PM   Modules accepted: Orders

## 2019-05-06 NOTE — Telephone Encounter (Signed)
Resent P.A. to [facility], evidentially pharmacy was using old insurance card

## 2019-05-11 ENCOUNTER — Encounter: Payer: Self-pay | Admitting: Family Medicine

## 2019-05-12 NOTE — Telephone Encounter (Signed)
P.A. Carole Binning denied, pt needs trial of 3 formulary alternatives Candesartan/HCTZ, Irbesartan/HCTZ, Losartan/HCTZ, Olmesartan/HCTZ, Telmisartan/HCTZ or Valsartan/HCTZ.  Pt has tried Losartan/HCTZ.  Do you want to switch to one of the others?

## 2019-05-13 MED ORDER — EDARBYCLOR 40-12.5 MG PO TABS: 1.0000 | ORAL_TABLET | Freq: Every day | ORAL | 0 refills | Status: DC

## 2019-05-13 NOTE — Telephone Encounter (Signed)
Is a third discount card or can we send it off to that pharmacy I think in [place]

## 2019-05-13 NOTE — Telephone Encounter (Signed)
Sent Rx to [facility] order Rx should be $40 a month, left message for pt

## 2019-05-16 ENCOUNTER — Other Ambulatory Visit: Payer: Self-pay | Admitting: Family Medicine

## 2019-05-16 DIAGNOSIS — I10 Essential (primary) hypertension: Secondary | ICD-10-CM

## 2019-05-19 ENCOUNTER — Other Ambulatory Visit: Payer: Self-pay

## 2019-05-19 ENCOUNTER — Encounter: Payer: Self-pay | Admitting: Cardiology

## 2019-05-19 ENCOUNTER — Ambulatory Visit (INDEPENDENT_AMBULATORY_CARE_PROVIDER_SITE_OTHER): Payer: 59 | Admitting: Cardiology

## 2019-05-19 VITALS — BP 179/112 | HR 83 | Temp 97.5°F | Ht 67.0 in | Wt 215.0 lb

## 2019-05-19 DIAGNOSIS — I209 Angina pectoris, unspecified: Secondary | ICD-10-CM | POA: Diagnosis not present

## 2019-05-19 MED ORDER — ROSUVASTATIN CALCIUM 20 MG PO TABS: 20.0000 mg | ORAL_TABLET | Freq: Every day | ORAL | 3 refills | Status: DC

## 2019-05-19 MED ORDER — CARVEDILOL 12.5 MG PO TABS: 12.5000 mg | ORAL_TABLET | Freq: Two times a day (BID) | ORAL | 3 refills | Status: DC

## 2019-05-19 NOTE — Progress Notes (Signed)
Patient referred by Troy Lung, MD for chest pain  Subjective:   Troy Garza, male    DOB: 18-Feb-1961, 58 y.o.   MRN: [id number]   Chief Complaint  Patient presents with  . Chest Pain    pt c/o chest pain on exertion on and off    HPI  58 y.o. Caucasian male with uncontrolled hypertension, hyperlipidemia, h/o DVT, referred for evaluation of chest pain.  Patient was in [place] a month ago with his girlfriend, when he noticed severe left sided chest pain with shortness of breath while biking up a hill. Symptoms would improve after rest. He had similar symptoms even with biking on flat road after that. He has been scared and avoided any physical exertion that level since then. He has not had any pain while walking in the neighborhood. He has not had to take any SL NTG.   He works in [employer], which involves a lot of physical work. He is currently furloughed. Paitent had a DVT in 2017 that was treated with anticoagulation. Hypercoagulability workup was reportedly negative.    Patient's mother died of aortic dissection. To his knowledge, she did not have any known hypertension.   Past Medical History:  Diagnosis Date  . ED (erectile dysfunction)   . GERD (gastroesophageal reflux disease)    hx of  . Hearing loss in left ear   . Hyperlipidemia   . Hypertension   . Neuromuscular disorder ([place])    hiatal hernia  . Vertigo    left ear drum burst as child and required multipkle surgeries     Past Surgical History:  Procedure Laterality Date  . ear surgery Left   . HERNIA REPAIR  as baby  . LUMBAR DISC SURGERY  8 years ago  . UMBILICAL HERNIA REPAIR  08/12/11     Social History   Socioeconomic History  . Marital status: Single    Spouse name: Not on file  . Number of children: Not on file  . Years of education: Not on file  . Highest education level: Not on file  Occupational History  . Not on file  Social Needs  . Financial resource strain: Not on file   . Food insecurity    Worry: Not on file    Inability: Not on file  . Transportation needs    Medical: Not on file    Non-medical: Not on file  Tobacco Use  . Smoking status: Never Smoker  . Smokeless tobacco: Never Used  Substance and Sexual Activity  . Alcohol use: Yes    Alcohol/week: 0.0 standard drinks    Comment: socially  . Drug use: No  . Sexual activity: Yes  Lifestyle  . Physical activity    Days per week: Not on file    Minutes per session: Not on file  . Stress: Not on file  Relationships  . Social Herbalist on phone: Not on file    Gets together: Not on file    Attends religious service: Not on file    Active member of club or organization: Not on file    Attends meetings of clubs or organizations: Not on file    Relationship status: Not on file  . Intimate partner violence    Fear of current or ex partner: Not on file    Emotionally abused: Not on file    Physically abused: Not on file    Forced sexual activity: Not on file  Other Topics Concern  . Not on file  Social History Narrative  . Not on file     Family History  Problem Relation Age of Onset  . Hypertension Father   . Colon polyps Father   . Heart disease Father   . Brain cancer Father   . Kidney cancer Father   . Bone cancer Father   . Liver cancer Father   . Colon cancer Neg Hx      Current Outpatient Medications on File Prior to Visit  Medication Sig Dispense Refill  . amLODipine (NORVASC) 5 MG tablet Take 1 tablet (5 mg total) by mouth daily. 90 tablet 3  . EDARBYCLOR 40-12.5 MG TABS TAKE ONE TABLET BY MOUTH DAILY  30 tablet 0  . LORazepam (ATIVAN) 0.5 MG tablet TAKE 1 TABLET TWICE A DAY AS NEEDED FOR ANXIETY (Patient not taking: Reported on 12/29/2017) 30 tablet 0  . Multiple Vitamins-Minerals (MULTIVITAMIN WITH MINERALS) tablet Take 1 tablet by mouth daily.    . nitroGLYCERIN (NITROSTAT) 0.4 MG SL tablet Place 1 tablet (0.4 mg total) under the tongue every 5 (five)  minutes as needed for chest pain. 50 tablet 3  . sildenafil (REVATIO) 20 MG tablet TAKE 1-5 TABLETS ONCE DAILY AS NEEDED. (Patient not taking: Reported on 04/14/2018) 50 tablet 5  . Testosterone (ANDROGEL PUMP) 20.25 MG/ACT (1.62%) GEL Place 2 Pump onto the skin daily. 75 g 5  . vardenafil (LEVITRA) 20 MG tablet Take 1 tablet (20 mg total) by mouth daily as needed for erectile dysfunction. 40 tablet 3  . vitamin C (ASCORBIC ACID) 500 MG tablet Take 500 mg by mouth daily.     No current facility-administered medications on file prior to visit.     Cardiovascular studies:  EKG 05/19/2019: Sinus rhythm 75 bpm. IVCD. Cannot exclude old anteroseptal infarct.    Recent labs: Results for Troy, Garza (MRN [id number]) as of 05/19/2019 09:24  Ref. Range 03/08/2018 14:59 04/29/2019 15:22  COMPREHENSIVE METABOLIC PANEL Unknown  Rpt (A)  Sodium Latest Ref Range: 134 - 144 mmol/L  142  Potassium Latest Ref Range: 3.5 - 5.2 mmol/L  3.9  Chloride Latest Ref Range: 96 - 106 mmol/L  104  CO2 Latest Ref Range: 20 - 29 mmol/L  23  Glucose Latest Ref Range: 65 - 99 mg/dL  121 (H)  BUN Latest Ref Range: 6 - 24 mg/dL  19  Creatinine Latest Ref Range: 0.76 - 1.27 mg/dL  0.97  Calcium Latest Ref Range: 8.7 - 10.2 mg/dL  10.5 (H)  BUN/Creatinine Ratio Latest Ref Range: 9 - 20   20  Alkaline Phosphatase Latest Ref Range: 39 - 117 IU/L  83  Albumin Latest Ref Range: 3.8 - 4.9 g/dL  4.9  Albumin/Globulin Ratio Latest Ref Range: 1.2 - 2.2   2.0  AST Latest Ref Range: 0 - 40 IU/L  35  ALT Latest Ref Range: 0 - 44 IU/L  75 (H)  Total Protein Latest Ref Range: 6.0 - 8.5 g/dL  7.3  Total Bilirubin Latest Ref Range: 0.0 - 1.2 mg/dL  0.5  GFR, Est Non African American Latest Ref Range: >59 mL/min/1.73  87  GFR, Est African American Latest Ref Range: >59 mL/min/1.73  100  Total CHOL/HDL Ratio Latest Ref Range: 0.0 - 5.0 ratio 4.4 4.9  Cholesterol, Total Latest Ref Range: 100 - 199 mg/dL 249 (H) 227 (H)  HDL  Cholesterol Latest Ref Range: >39 mg/dL 56 46  LDL (calc) Latest Ref Range: 0 - 99  mg/dL 167 (H) 145 (H)  Triglycerides Latest Ref Range: 0 - 149 mg/dL 129 178 (H)  VLDL Cholesterol Cal Latest Ref Range: 5 - 40 mg/dL 26 36   Results for Troy, Garza (MRN [id number]) as of 05/19/2019 09:24  Ref. Range 04/29/2019 15:22  WBC Latest Ref Range: 3.4 - 10.8 x10E3/uL 8.2  RBC Latest Ref Range: 4.14 - 5.80 x10E6/uL 5.73  Hemoglobin Latest Ref Range: 13.0 - 17.7 g/dL 16.4  HCT Latest Ref Range: 37.5 - 51.0 % 48.8  MCV Latest Ref Range: 79 - 97 fL 85  MCH Latest Ref Range: 26.6 - 33.0 pg 28.6  MCHC Latest Ref Range: 31.5 - 35.7 g/dL 33.6  RDW Latest Ref Range: 11.6 - 15.4 % 13.2  Platelets Latest Ref Range: 150 - 450 x10E3/uL 234   Results for TRESTAN, VAHLE (MRN [id number]) as of 05/19/2019 09:24  Ref. Range 12/29/2017 10:06 04/29/2019 15:22  Glucose Latest Ref Range: 65 - 99 mg/dL 164 (H) 121 (H)  Testosterone Latest Ref Range: 264 - 916 ng/dL  220 (L)    Review of Systems  Constitution: Negative for decreased appetite, malaise/fatigue, weight gain and weight loss.  HENT: Negative for congestion.   Eyes: Negative for visual disturbance.  Cardiovascular: Positive for chest pain and dyspnea on exertion. Negative for leg swelling, palpitations and syncope.  Respiratory: Negative for cough.   Endocrine: Negative for cold intolerance.  Hematologic/Lymphatic: Does not bruise/bleed easily.  Skin: Negative for itching and rash.  Musculoskeletal: Negative for myalgias.  Gastrointestinal: Negative for abdominal pain, nausea and vomiting.  Genitourinary: Negative for dysuria.       Erectile dysfunction  Neurological: Negative for dizziness and weakness.  Psychiatric/Behavioral: The patient is not nervous/anxious.   All other systems reviewed and are negative.       Vitals:   05/19/19 1254  BP: (!) 179/112  Pulse: 83  Temp: (!) 97.5 F (36.4 C)  SpO2: 95%     Body mass index is 33.67 kg/m.  Filed Weights   05/19/19 1254  Weight: 215 lb (97.5 kg)     Objective:   Physical Exam  Constitutional: He is oriented to person, place, and time. He appears well-developed and well-nourished. No distress.  HENT:  Head: Normocephalic and atraumatic.  Eyes: Pupils are equal, round, and reactive to light. Conjunctivae are normal.  Neck: No JVD present.  Cardiovascular: Normal rate, regular rhythm and intact distal pulses.  Pulmonary/Chest: Effort normal and breath sounds normal. He has no wheezes. He has no rales.  Abdominal: Soft. Bowel sounds are normal. There is no rebound.  Musculoskeletal:        General: No edema.  Lymphadenopathy:    He has no cervical adenopathy.  Neurological: He is alert and oriented to person, place, and time. No cranial nerve deficit.  Skin: Skin is warm and dry.  Psychiatric: He has a normal mood and affect.  Nursing note and vitals reviewed.         Assessment & Recommendations:   58 y.o. Caucasian male with uncontrolled hypertension, hyperlipidemia, h/o DVT, referred for evaluation of chest pain.  Angina: Symptoms suggest typical angina. He also has uncontrolled hypertension and hyperlipidemia. Strong family h/o aortic dissection. He doe snot have any AI on exam.  Will obtain echocardiogram and nuclear stress test.  Recommend Aspirin 81 mg, Crestor 20 mg. Added carvedilol 12.5 mg bid. Continue amlodipine 5 mg daily. SL NTG for as needed use.   Thank you for referring the patient to [place]. Please  feel free to contact with any questions.  Nigel Mormon, MD [facility] Pager: [phone] Office: [phone] If no answer Cell [phone]

## 2019-05-23 NOTE — Telephone Encounter (Signed)
Generic Androgel was approved til 04/29/20 called pharmacy and went thru for $128 for 30 days.  Left message for pt

## 2019-06-09 ENCOUNTER — Ambulatory Visit (INDEPENDENT_AMBULATORY_CARE_PROVIDER_SITE_OTHER): Payer: 59

## 2019-06-09 ENCOUNTER — Other Ambulatory Visit: Payer: Self-pay

## 2019-06-09 DIAGNOSIS — I209 Angina pectoris, unspecified: Secondary | ICD-10-CM | POA: Diagnosis not present

## 2019-06-13 ENCOUNTER — Other Ambulatory Visit: Payer: Self-pay

## 2019-06-13 ENCOUNTER — Ambulatory Visit (INDEPENDENT_AMBULATORY_CARE_PROVIDER_SITE_OTHER): Payer: 59

## 2019-06-13 DIAGNOSIS — I200 Unstable angina: Secondary | ICD-10-CM | POA: Diagnosis not present

## 2019-06-13 DIAGNOSIS — I209 Angina pectoris, unspecified: Secondary | ICD-10-CM

## 2019-06-21 ENCOUNTER — Other Ambulatory Visit: Payer: Self-pay | Admitting: Family Medicine

## 2019-06-21 DIAGNOSIS — I10 Essential (primary) hypertension: Secondary | ICD-10-CM

## 2019-06-23 ENCOUNTER — Encounter: Payer: Self-pay | Admitting: Cardiology

## 2019-06-23 ENCOUNTER — Ambulatory Visit (INDEPENDENT_AMBULATORY_CARE_PROVIDER_SITE_OTHER): Payer: 59 | Admitting: Cardiology

## 2019-06-23 ENCOUNTER — Other Ambulatory Visit: Payer: Self-pay

## 2019-06-23 ENCOUNTER — Ambulatory Visit: Payer: 59 | Admitting: Cardiology

## 2019-06-23 DIAGNOSIS — E782 Mixed hyperlipidemia: Secondary | ICD-10-CM | POA: Diagnosis not present

## 2019-06-23 DIAGNOSIS — I209 Angina pectoris, unspecified: Secondary | ICD-10-CM | POA: Diagnosis not present

## 2019-06-23 NOTE — Progress Notes (Signed)
Patient referred by Denita Lung, MD for chest pain  Subjective:   Troy Garza, male    DOB: Sep 18, 1961, 58 y.o.   MRN: [id number]  I connected with the patient on 06/23/2019 by a telephone call and verified that I am speaking with the correct person using two identifiers.     I offered the patient a video enabled application for a virtual visit. Unfortunately, this could not be accomplished due to technical difficulties/lack of video enabled phone/computer. I discussed the limitations of evaluation and management by telemedicine and the availability of in person appointments. The patient expressed understanding and agreed to proceed.   This visit type was conducted due to national recommendations for restrictions regarding the COVID-19 Pandemic (e.g. social distancing).  This format is felt to be most appropriate for this patient at this time.  All issues noted in this document were discussed and addressed.  No physical exam was performed (except for noted visual exam findings with Tele health visits).  The patient has consented to conduct a Tele health visit and understands insurance will be billed.   Chief Complaint  Patient presents with  . Chest Pain    HPI  58 y.o. Caucasian male with uncontrolled hypertension, hyperlipidemia, h/o DVT, referred for evaluation of chest pain.  Since his last visit, he has been compliant with his medical therapy. He has not had any recurrence of chest pain symptoms. He has not had his BP checked.   Stress test and echocardiogram findings discussed with the patient.   Past Medical History:  Diagnosis Date  . ED (erectile dysfunction)   . GERD (gastroesophageal reflux disease)    hx of  . Hearing loss in left ear   . Hyperlipidemia   . Hypertension   . Neuromuscular disorder ([place])    hiatal hernia  . Vertigo    left ear drum burst as child and required multipkle surgeries     Past Surgical History:  Procedure Laterality Date  . ear  surgery Left   . HERNIA REPAIR  as baby  . LUMBAR DISC SURGERY  8 years ago  . UMBILICAL HERNIA REPAIR  08/12/11     Social History   Socioeconomic History  . Marital status: Single    Spouse name: Not on file  . Number of children: Not on file  . Years of education: Not on file  . Highest education level: Not on file  Occupational History  . Not on file  Social Needs  . Financial resource strain: Not on file  . Food insecurity    Worry: Not on file    Inability: Not on file  . Transportation needs    Medical: Not on file    Non-medical: Not on file  Tobacco Use  . Smoking status: Never Smoker  . Smokeless tobacco: Never Used  Substance and Sexual Activity  . Alcohol use: Yes    Alcohol/week: 0.0 standard drinks    Comment: socially  . Drug use: No  . Sexual activity: Yes  Lifestyle  . Physical activity    Days per week: Not on file    Minutes per session: Not on file  . Stress: Not on file  Relationships  . Social Herbalist on phone: Not on file    Gets together: Not on file    Attends religious service: Not on file    Active member of club or organization: Not on file    Attends meetings of  clubs or organizations: Not on file    Relationship status: Not on file  . Intimate partner violence    Fear of current or ex partner: Not on file    Emotionally abused: Not on file    Physically abused: Not on file    Forced sexual activity: Not on file  Other Topics Concern  . Not on file  Social History Narrative  . Not on file     Family History  Problem Relation Age of Onset  . Hypertension Father   . Colon polyps Father   . Heart disease Father   . Brain cancer Father   . Kidney cancer Father   . Bone cancer Father   . Liver cancer Father   . Aortic dissection Mother   . Colon cancer Neg Hx      Current Outpatient Medications on File Prior to Visit  Medication Sig Dispense Refill  . amLODipine (NORVASC) 5 MG tablet Take 1 tablet (5 mg  total) by mouth daily. 90 tablet 3  . carvedilol (COREG) 12.5 MG tablet Take 1 tablet (12.5 mg total) by mouth 2 (two) times daily. 60 tablet 3  . EDARBYCLOR 40-12.5 MG TABS TAKE ONE TABLET BY MOUTH DAILY  30 tablet 0  . Multiple Vitamins-Minerals (MULTIVITAMIN WITH MINERALS) tablet Take 1 tablet by mouth daily.    . nitroGLYCERIN (NITROSTAT) 0.4 MG SL tablet Place 1 tablet (0.4 mg total) under the tongue every 5 (five) minutes as needed for chest pain. 50 tablet 3  . omeprazole (PRILOSEC) 20 MG capsule Take 20 mg by mouth daily.    . rosuvastatin (CRESTOR) 20 MG tablet Take 1 tablet (20 mg total) by mouth daily. 30 tablet 3  . Testosterone (ANDROGEL PUMP) 20.25 MG/ACT (1.62%) GEL Place 2 Pump onto the skin daily. 75 g 5  . vitamin C (ASCORBIC ACID) 500 MG tablet Take 500 mg by mouth daily.    Marland Kitchen zinc gluconate 50 MG tablet Take 50 mg by mouth daily.     No current facility-administered medications on file prior to visit.     Cardiovascular studies:  EKG 05/19/2019: Sinus rhythm 75 bpm. IVCD. Cannot exclude old anteroseptal infarct.    Recent labs: Results for MIKEN, STECHER (MRN [id number]) as of 05/19/2019 09:24  Ref. Range 03/08/2018 14:59 04/29/2019 15:22  COMPREHENSIVE METABOLIC PANEL Unknown  Rpt (A)  Sodium Latest Ref Range: 134 - 144 mmol/L  142  Potassium Latest Ref Range: 3.5 - 5.2 mmol/L  3.9  Chloride Latest Ref Range: 96 - 106 mmol/L  104  CO2 Latest Ref Range: 20 - 29 mmol/L  23  Glucose Latest Ref Range: 65 - 99 mg/dL  121 (H)  BUN Latest Ref Range: 6 - 24 mg/dL  19  Creatinine Latest Ref Range: 0.76 - 1.27 mg/dL  0.97  Calcium Latest Ref Range: 8.7 - 10.2 mg/dL  10.5 (H)  BUN/Creatinine Ratio Latest Ref Range: 9 - 20   20  Alkaline Phosphatase Latest Ref Range: 39 - 117 IU/L  83  Albumin Latest Ref Range: 3.8 - 4.9 g/dL  4.9  Albumin/Globulin Ratio Latest Ref Range: 1.2 - 2.2   2.0  AST Latest Ref Range: 0 - 40 IU/L  35  ALT Latest Ref Range: 0 - 44 IU/L  75 (H)  Total  Protein Latest Ref Range: 6.0 - 8.5 g/dL  7.3  Total Bilirubin Latest Ref Range: 0.0 - 1.2 mg/dL  0.5  GFR, Est Non African American Latest Ref Range: >59  mL/min/1.73  87  GFR, Est African American Latest Ref Range: >59 mL/min/1.73  100  Total CHOL/HDL Ratio Latest Ref Range: 0.0 - 5.0 ratio 4.4 4.9  Cholesterol, Total Latest Ref Range: 100 - 199 mg/dL 249 (H) 227 (H)  HDL Cholesterol Latest Ref Range: >39 mg/dL 56 46  LDL (calc) Latest Ref Range: 0 - 99 mg/dL 167 (H) 145 (H)  Triglycerides Latest Ref Range: 0 - 149 mg/dL 129 178 (H)  VLDL Cholesterol Cal Latest Ref Range: 5 - 40 mg/dL 26 36   Results for ERMON, SAGAN (MRN [id number]) as of 05/19/2019 09:24  Ref. Range 04/29/2019 15:22  WBC Latest Ref Range: 3.4 - 10.8 x10E3/uL 8.2  RBC Latest Ref Range: 4.14 - 5.80 x10E6/uL 5.73  Hemoglobin Latest Ref Range: 13.0 - 17.7 g/dL 16.4  HCT Latest Ref Range: 37.5 - 51.0 % 48.8  MCV Latest Ref Range: 79 - 97 fL 85  MCH Latest Ref Range: 26.6 - 33.0 pg 28.6  MCHC Latest Ref Range: 31.5 - 35.7 g/dL 33.6  RDW Latest Ref Range: 11.6 - 15.4 % 13.2  Platelets Latest Ref Range: 150 - 450 x10E3/uL 234   Results for BERNARDO, BRAYMAN (MRN [id number]) as of 05/19/2019 09:24  Ref. Range 12/29/2017 10:06 04/29/2019 15:22  Glucose Latest Ref Range: 65 - 99 mg/dL 164 (H) 121 (H)  Testosterone Latest Ref Range: 264 - 916 ng/dL  220 (L)    Review of Systems  Constitution: Negative for decreased appetite, malaise/fatigue, weight gain and weight loss.  HENT: Negative for congestion.   Eyes: Negative for visual disturbance.  Cardiovascular: Positive for chest pain and dyspnea on exertion. Negative for leg swelling, palpitations and syncope.  Respiratory: Negative for cough.   Endocrine: Negative for cold intolerance.  Hematologic/Lymphatic: Does not bruise/bleed easily.  Skin: Negative for itching and rash.  Musculoskeletal: Negative for myalgias.  Gastrointestinal: Negative for abdominal pain, nausea and vomiting.   Genitourinary: Negative for dysuria.       Erectile dysfunction  Neurological: Negative for dizziness and weakness.  Psychiatric/Behavioral: The patient is not nervous/anxious.   All other systems reviewed and are negative.   Vitals not available.   Objective:   Physical Exam  Not performed. Telephone visit.        Assessment & Recommendations:   58 y.o. Caucasian male with uncontrolled hypertension, hyperlipidemia, h/o DVT, referred for evaluation of chest pain.  Angina: Symptoms much improved on current anti anginal and antihypertensive therapy. No abnormalities on stress test.  Continue Crestor 20 mg. Continue carvedilol 12.5 mg bid. Continue amlodipine 5 mg daily. SL NTG for as needed use.  Okay to stop Aspirin.  Follow up in 6 months. Continue follow up with PCP regarding blood pressure management.   Nigel Mormon, MD [facility] Pager: [phone] Office: [phone] If no answer Cell [phone]

## 2019-06-30 ENCOUNTER — Ambulatory Visit (INDEPENDENT_AMBULATORY_CARE_PROVIDER_SITE_OTHER): Payer: 59 | Admitting: Family Medicine

## 2019-06-30 ENCOUNTER — Other Ambulatory Visit: Payer: Self-pay

## 2019-06-30 ENCOUNTER — Encounter: Payer: Self-pay | Admitting: Family Medicine

## 2019-06-30 VITALS — BP 136/82 | HR 82 | Temp 98.3°F | Wt 209.6 lb

## 2019-06-30 DIAGNOSIS — E291 Testicular hypofunction: Secondary | ICD-10-CM

## 2019-06-30 DIAGNOSIS — I10 Essential (primary) hypertension: Secondary | ICD-10-CM | POA: Diagnosis not present

## 2019-06-30 MED ORDER — CARVEDILOL 25 MG PO TABS: 25.0000 mg | ORAL_TABLET | Freq: Two times a day (BID) | ORAL | 3 refills | Status: DC

## 2019-06-30 NOTE — Progress Notes (Signed)
   Subjective:    Patient ID: Troy Garza, male    DOB: [birth year], 58 y.o.   MRN: [id number]  HPI He is here for a recheck.  He was recently seen by cardiology and is now on Coreg as well as amlodipine.  His stress test was negative.  [facility]  The regular dosing and cost was too expensive for him.   Review of Systems     Objective:   Physical Exam Alert and in no distress otherwise not examined       Assessment & Plan:  Essential hypertension - Plan: carvedilol (COREG) 25 MG tablet, I will increase his Coreg to 25 mg twice daily to lower his blood pressure.  Recheck in 1 month.  He is also to bring his blood pressure cuff with him.  Male hypogonadism - Plan: Recheck here in 1 month.

## 2019-07-01 ENCOUNTER — Other Ambulatory Visit: Payer: Self-pay | Admitting: Family Medicine

## 2019-07-01 DIAGNOSIS — I10 Essential (primary) hypertension: Secondary | ICD-10-CM

## 2019-07-05 ENCOUNTER — Encounter: Payer: Self-pay | Admitting: Family Medicine

## 2019-07-08 NOTE — Telephone Encounter (Signed)
Pt called again and he did get mail order Rx

## 2019-07-08 NOTE — Telephone Encounter (Signed)
I had called pt back and advised of cost and due to expense I advised he could discuss with Dr. Redmond School about compounding cream option

## 2019-07-19 ENCOUNTER — Encounter: Payer: Self-pay | Admitting: Family Medicine

## 2019-07-29 ENCOUNTER — Other Ambulatory Visit: Payer: Self-pay | Admitting: Family Medicine

## 2019-07-29 DIAGNOSIS — I10 Essential (primary) hypertension: Secondary | ICD-10-CM

## 2019-07-29 NOTE — Telephone Encounter (Signed)
Left message for pt to call back to schedule an appt as he was due for 1 month follow-up on HTN

## 2019-07-29 NOTE — Telephone Encounter (Signed)
Once scheduled we can refill to get him to his appt

## 2019-08-23 ENCOUNTER — Other Ambulatory Visit: Payer: Self-pay | Admitting: Family Medicine

## 2019-08-23 DIAGNOSIS — I10 Essential (primary) hypertension: Secondary | ICD-10-CM

## 2019-08-23 NOTE — Telephone Encounter (Signed)
Tried to call pt as he is due for a follow-up on HTN but VM is full

## 2019-09-13 ENCOUNTER — Other Ambulatory Visit: Payer: Self-pay | Admitting: Cardiology

## 2019-09-13 DIAGNOSIS — I209 Angina pectoris, unspecified: Secondary | ICD-10-CM

## 2019-11-29 DIAGNOSIS — U071 COVID-19: Secondary | ICD-10-CM | POA: Diagnosis not present

## 2019-12-05 ENCOUNTER — Telehealth: Payer: Self-pay | Admitting: Family Medicine

## 2019-12-05 NOTE — Telephone Encounter (Signed)
Pt called and said he is on day 6 of Covid. He did not want to schedule a virtual visit but did want to know if should continue to take his BP medications because he hasn't been able to eat much and his medications have been making him nauseous. Pt can be reached at [phone] and said you can leave a message if he does not answer.

## 2019-12-05 NOTE — Telephone Encounter (Signed)
Pt was advised Troy Garza 

## 2019-12-05 NOTE — Telephone Encounter (Signed)
If he misses a few days, not a big issue but would prefer that he stay on the medication.  If he cannot that is fine he can start it as soon as he feels more comfortable

## 2019-12-08 ENCOUNTER — Ambulatory Visit (INDEPENDENT_AMBULATORY_CARE_PROVIDER_SITE_OTHER): Payer: BC Managed Care – PPO | Admitting: Family Medicine

## 2019-12-08 ENCOUNTER — Other Ambulatory Visit: Payer: Self-pay

## 2019-12-08 VITALS — Temp 101.0°F | Wt 209.0 lb

## 2019-12-08 DIAGNOSIS — U071 COVID-19: Secondary | ICD-10-CM | POA: Diagnosis not present

## 2019-12-08 NOTE — Progress Notes (Addendum)
   Subjective:    Patient ID: Troy Garza, male    DOB: Nov 09, 1961, 59 y.o.   MRN: [id number]  HPI Documentation for virtual telephone encounter.  Documentation for virtual audio and video telecommunications through [place] encounter:  The patient was located at home. The provider was located in the office. The patient did consent to this visit and is aware of possible charges through their insurance for this visit. The other persons participating in this telemedicine service were none. This virtual service is not related to other E/M service within previous 7 days. He states that on January 12 he developed malaise and went to get tested.  That test was positive.  Since then he has gotten much worse having difficulty with fever, chills, malaise, myalgias, weakness with smell and taste change as well as nausea, headache and diarrhea.  He has had no difficulty with cough or shortness of breath.  He has been taking Tylenol.  He states that he is feeling slightly better today.  He has been getting in plenty of fluids.  He is urinating without difficulty.   Review of Systems     Objective:   Physical Exam Alert and sounding very weak.      Assessment & Plan:  COVID-19 I explained that since he is not short of breath or coughing and he is feeling slightly better today, lets continue with conservative care of fluids, Tylenol as many as to 4 times per day.  He can also take 4 ibuprofen 3 times per day to help with the fever aches and pains.  He is to continue with his fluids.  He is to call me tomorrow to let me know how he is doing.  If his symptoms get worse instructed him to go to the hospital.  He was comfortable with that. 12/09/19 8:10 I called to see how he is doing and he said he had a really rough night and feeling worse.  I recommended that he call an ambulance and be taken to the hospital.

## 2019-12-09 ENCOUNTER — Encounter (HOSPITAL_COMMUNITY): Payer: Self-pay | Admitting: Emergency Medicine

## 2019-12-09 ENCOUNTER — Emergency Department (HOSPITAL_COMMUNITY): Payer: BC Managed Care – PPO

## 2019-12-09 ENCOUNTER — Inpatient Hospital Stay (HOSPITAL_COMMUNITY)
Admission: EM | Admit: 2019-12-09 | Discharge: 2019-12-13 | DRG: 871 | Disposition: A | Discharge status: Home / Self Care | Payer: BC Managed Care – PPO | Attending: Internal Medicine | Admitting: Internal Medicine

## 2019-12-09 DIAGNOSIS — Z8249 Family history of ischemic heart disease and other diseases of the circulatory system: Secondary | ICD-10-CM | POA: Diagnosis not present

## 2019-12-09 DIAGNOSIS — E872 Acidosis: Secondary | ICD-10-CM | POA: Diagnosis not present

## 2019-12-09 DIAGNOSIS — Z86718 Personal history of other venous thrombosis and embolism: Secondary | ICD-10-CM | POA: Diagnosis not present

## 2019-12-09 DIAGNOSIS — Z6830 Body mass index (BMI) 30.0-30.9, adult: Secondary | ICD-10-CM

## 2019-12-09 DIAGNOSIS — E871 Hypo-osmolality and hyponatremia: Secondary | ICD-10-CM | POA: Diagnosis present

## 2019-12-09 DIAGNOSIS — I10 Essential (primary) hypertension: Secondary | ICD-10-CM | POA: Diagnosis not present

## 2019-12-09 DIAGNOSIS — R7989 Other specified abnormal findings of blood chemistry: Secondary | ICD-10-CM | POA: Diagnosis present

## 2019-12-09 DIAGNOSIS — Z79899 Other long term (current) drug therapy: Secondary | ICD-10-CM | POA: Diagnosis not present

## 2019-12-09 DIAGNOSIS — Z880 Allergy status to penicillin: Secondary | ICD-10-CM | POA: Diagnosis not present

## 2019-12-09 DIAGNOSIS — I9589 Other hypotension: Secondary | ICD-10-CM | POA: Diagnosis not present

## 2019-12-09 DIAGNOSIS — J9601 Acute respiratory failure with hypoxia: Secondary | ICD-10-CM | POA: Diagnosis present

## 2019-12-09 DIAGNOSIS — Z800 Family history of malignant neoplasm of digestive organs: Secondary | ICD-10-CM

## 2019-12-09 DIAGNOSIS — J1282 Pneumonia due to coronavirus disease 2019: Secondary | ICD-10-CM | POA: Diagnosis not present

## 2019-12-09 DIAGNOSIS — E785 Hyperlipidemia, unspecified: Secondary | ICD-10-CM | POA: Diagnosis present

## 2019-12-09 DIAGNOSIS — U071 COVID-19: Secondary | ICD-10-CM | POA: Diagnosis not present

## 2019-12-09 DIAGNOSIS — Z8051 Family history of malignant neoplasm of kidney: Secondary | ICD-10-CM | POA: Diagnosis not present

## 2019-12-09 DIAGNOSIS — Z808 Family history of malignant neoplasm of other organs or systems: Secondary | ICD-10-CM

## 2019-12-09 DIAGNOSIS — R0902 Hypoxemia: Secondary | ICD-10-CM | POA: Diagnosis not present

## 2019-12-09 DIAGNOSIS — E669 Obesity, unspecified: Secondary | ICD-10-CM | POA: Diagnosis not present

## 2019-12-09 DIAGNOSIS — R0689 Other abnormalities of breathing: Secondary | ICD-10-CM | POA: Diagnosis not present

## 2019-12-09 DIAGNOSIS — R0602 Shortness of breath: Secondary | ICD-10-CM | POA: Diagnosis not present

## 2019-12-09 DIAGNOSIS — A4189 Other specified sepsis: Secondary | ICD-10-CM | POA: Diagnosis not present

## 2019-12-09 DIAGNOSIS — E876 Hypokalemia: Secondary | ICD-10-CM | POA: Diagnosis present

## 2019-12-09 LAB — C-REACTIVE PROTEIN
CRP: 6.5 mg/dL — ABNORMAL HIGH (ref <1.0)
CRP: 7 mg/dL — ABNORMAL HIGH (ref <1.0)

## 2019-12-09 LAB — COMPREHENSIVE METABOLIC PANEL
ALT: 108 U/L — ABNORMAL HIGH (ref 0–44)
AST: 77 U/L — ABNORMAL HIGH (ref 15–41)
Albumin: 3.6 g/dL (ref 3.5–5.0)
Alkaline Phosphatase: 60 U/L (ref 38–126)
Anion gap: 13 (ref 5–15)
BUN: 17 mg/dL (ref 6–20)
CO2: 23 mmol/L (ref 22–32)
Calcium: 8.1 mg/dL — ABNORMAL LOW (ref 8.9–10.3)
Chloride: 95 mmol/L — ABNORMAL LOW (ref 98–111)
Creatinine, Ser: 0.82 mg/dL (ref 0.61–1.24)
GFR calc Af Amer: >60 mL/min (ref >60)
GFR calc non Af Amer: >60 mL/min (ref >60)
Glucose, Bld: 127 mg/dL — ABNORMAL HIGH (ref 70–99)
Potassium: 2.4 mmol/L — CL (ref 3.5–5.1)
Sodium: 131 mmol/L — ABNORMAL LOW (ref 135–145)
Total Bilirubin: 1.3 mg/dL — ABNORMAL HIGH (ref 0.3–1.2)
Total Protein: 7.3 g/dL (ref 6.5–8.1)

## 2019-12-09 LAB — CBC
HCT: 45.6 % (ref 39.0–52.0)
Hemoglobin: 15.6 g/dL (ref 13.0–17.0)
MCH: 28.7 pg (ref 26.0–34.0)
MCHC: 34.2 g/dL (ref 30.0–36.0)
MCV: 84 fL (ref 80.0–100.0)
Platelets: 212 10*3/uL (ref 150–400)
RBC: 5.43 MIL/uL (ref 4.22–5.81)
RDW: 13.2 % (ref 11.5–15.5)
WBC: 10.4 10*3/uL (ref 4.0–10.5)
nRBC: 0 % (ref 0.0–0.2)

## 2019-12-09 LAB — CBC WITH DIFFERENTIAL/PLATELET
Abs Immature Granulocytes: 0.07 10*3/uL (ref 0.00–0.07)
Basophils Absolute: 0 10*3/uL (ref 0.0–0.1)
Basophils Relative: 0 %
Eosinophils Absolute: 0 10*3/uL (ref 0.0–0.5)
Eosinophils Relative: 0 %
HCT: 43.7 % (ref 39.0–52.0)
Hemoglobin: 15.5 g/dL (ref 13.0–17.0)
Immature Granulocytes: 1 %
Lymphocytes Relative: 13 %
Lymphs Abs: 1.1 10*3/uL (ref 0.7–4.0)
MCH: 28.8 pg (ref 26.0–34.0)
MCHC: 35.5 g/dL (ref 30.0–36.0)
MCV: 81.2 fL (ref 80.0–100.0)
Monocytes Absolute: 0.6 10*3/uL (ref 0.1–1.0)
Monocytes Relative: 7 %
Neutro Abs: 6.8 10*3/uL (ref 1.7–7.7)
Neutrophils Relative %: 79 %
Platelets: 188 10*3/uL (ref 150–400)
RBC: 5.38 MIL/uL (ref 4.22–5.81)
RDW: 13.1 % (ref 11.5–15.5)
WBC: 8.7 10*3/uL (ref 4.0–10.5)
nRBC: 0 % (ref 0.0–0.2)

## 2019-12-09 LAB — LACTIC ACID, PLASMA
Lactic Acid, Venous: 1 mmol/L (ref 0.5–1.9)
Lactic Acid, Venous: 2.5 mmol/L (ref 0.5–1.9)

## 2019-12-09 LAB — MAGNESIUM: Magnesium: 2.2 mg/dL (ref 1.7–2.4)

## 2019-12-09 LAB — PROCALCITONIN: Procalcitonin: 0.12 ng/mL

## 2019-12-09 LAB — LACTATE DEHYDROGENASE: LDH: 337 U/L — ABNORMAL HIGH (ref 98–192)

## 2019-12-09 LAB — TRIGLYCERIDES: Triglycerides: 79 mg/dL (ref <150)

## 2019-12-09 LAB — HIV ANTIBODY (ROUTINE TESTING W REFLEX): HIV Screen 4th Generation wRfx: NONREACTIVE

## 2019-12-09 LAB — INFLUENZA PANEL BY PCR (TYPE A & B)
Influenza A By PCR: NEGATIVE
Influenza B By PCR: NEGATIVE

## 2019-12-09 LAB — FERRITIN: Ferritin: 1349 ng/mL — ABNORMAL HIGH (ref 24–336)

## 2019-12-09 LAB — CREATININE, SERUM
Creatinine, Ser: 1.16 mg/dL (ref 0.61–1.24)
GFR calc Af Amer: >60 mL/min (ref >60)
GFR calc non Af Amer: >60 mL/min (ref >60)

## 2019-12-09 LAB — FIBRINOGEN: Fibrinogen: 623 mg/dL — ABNORMAL HIGH (ref 210–475)

## 2019-12-09 LAB — D-DIMER, QUANTITATIVE: D-Dimer, Quant: 1.29 ug/mL-FEU — ABNORMAL HIGH (ref 0.00–0.50)

## 2019-12-09 LAB — POC SARS CORONAVIRUS 2 AG -  ED: SARS Coronavirus 2 Ag: POSITIVE — AB

## 2019-12-09 LAB — BRAIN NATRIURETIC PEPTIDE: B Natriuretic Peptide: 57 pg/mL (ref 0.0–100.0)

## 2019-12-09 LAB — ABO/RH: ABO/RH(D): A POS

## 2019-12-09 MED ORDER — POLYETHYLENE GLYCOL 3350 17 G PO PACK: 17.0000 g | PACK | Freq: Every day | ORAL | Status: DC | PRN

## 2019-12-09 MED ORDER — ACETAMINOPHEN 325 MG PO TABS
650.0000 mg | ORAL_TABLET | Freq: Four times a day (QID) | ORAL | Status: DC | PRN
  Administered 2019-12-09: 14:00:00 650 mg via ORAL
  Filled 2019-12-09: qty 2

## 2019-12-09 MED ORDER — ACETAMINOPHEN 650 MG RE SUPP
650.0000 mg | Freq: Four times a day (QID) | RECTAL | Status: DC | PRN
  Filled 2019-12-09: qty 1

## 2019-12-09 MED ORDER — SODIUM CHLORIDE 0.9 % IV SOLN: INTRAVENOUS | Status: DC | PRN

## 2019-12-09 MED ORDER — HYDROCOD POLST-CPM POLST ER 10-8 MG/5ML PO SUER
5.0000 mL | Freq: Once | ORAL | Status: AC
  Administered 2019-12-09: 5 mL via ORAL
  Filled 2019-12-09: qty 5

## 2019-12-09 MED ORDER — SODIUM CHLORIDE 0.9 % IV SOLN
200.0000 mg | Freq: Once | INTRAVENOUS | Status: AC
  Administered 2019-12-09: 13:00:00 200 mg via INTRAVENOUS
  Filled 2019-12-09: qty 200

## 2019-12-09 MED ORDER — POTASSIUM CHLORIDE 10 MEQ/100ML IV SOLN
10.0000 meq | INTRAVENOUS | Status: AC
  Administered 2019-12-09 (×4): 10 meq via INTRAVENOUS
  Filled 2019-12-09 (×4): qty 100

## 2019-12-09 MED ORDER — ONDANSETRON HCL 4 MG/2ML IJ SOLN
4.0000 mg | Freq: Once | INTRAMUSCULAR | Status: AC
  Administered 2019-12-09: 4 mg via INTRAVENOUS
  Filled 2019-12-09: qty 2

## 2019-12-09 MED ORDER — DEXAMETHASONE SODIUM PHOSPHATE 10 MG/ML IJ SOLN
6.0000 mg | INTRAMUSCULAR | Status: DC
  Administered 2019-12-09 – 2019-12-11 (×3): 6 mg via INTRAVENOUS
  Filled 2019-12-09 (×3): qty 1

## 2019-12-09 MED ORDER — AMLODIPINE BESYLATE 5 MG PO TABS
5.0000 mg | ORAL_TABLET | Freq: Every day | ORAL | Status: DC
  Administered 2019-12-10: 5 mg via ORAL
  Filled 2019-12-09 (×2): qty 1

## 2019-12-09 MED ORDER — CHLORTHALIDONE 25 MG PO TABS
12.5000 mg | ORAL_TABLET | Freq: Every day | ORAL | Status: DC
  Administered 2019-12-09: 12.5 mg via ORAL
  Filled 2019-12-09: qty 0.5

## 2019-12-09 MED ORDER — ONDANSETRON HCL 4 MG/2ML IJ SOLN
4.0000 mg | Freq: Four times a day (QID) | INTRAMUSCULAR | Status: DC | PRN
  Administered 2019-12-09: 4 mg via INTRAVENOUS
  Filled 2019-12-09: qty 2

## 2019-12-09 MED ORDER — KETOROLAC TROMETHAMINE 15 MG/ML IJ SOLN
15.0000 mg | Freq: Once | INTRAMUSCULAR | Status: AC
  Administered 2019-12-09: 15 mg via INTRAVENOUS
  Filled 2019-12-09: qty 1

## 2019-12-09 MED ORDER — ONDANSETRON HCL 4 MG PO TABS: 4.0000 mg | ORAL_TABLET | Freq: Four times a day (QID) | ORAL | Status: DC | PRN

## 2019-12-09 MED ORDER — ASCORBIC ACID 500 MG PO TABS
500.0000 mg | ORAL_TABLET | Freq: Every day | ORAL | Status: DC
  Administered 2019-12-10 – 2019-12-13 (×4): 500 mg via ORAL
  Filled 2019-12-09 (×5): qty 1

## 2019-12-09 MED ORDER — PANTOPRAZOLE SODIUM 40 MG PO TBEC
40.0000 mg | DELAYED_RELEASE_TABLET | Freq: Every day | ORAL | Status: DC
  Administered 2019-12-10 – 2019-12-13 (×4): 40 mg via ORAL
  Filled 2019-12-09 (×5): qty 1

## 2019-12-09 MED ORDER — MAGNESIUM OXIDE 400 (241.3 MG) MG PO TABS
800.0000 mg | ORAL_TABLET | Freq: Once | ORAL | Status: AC
  Administered 2019-12-09: 800 mg via ORAL
  Filled 2019-12-09: qty 2

## 2019-12-09 MED ORDER — SODIUM CHLORIDE 0.9 % IV SOLN: 200.0000 mg | Freq: Once | INTRAVENOUS | Status: DC

## 2019-12-09 MED ORDER — CARVEDILOL 12.5 MG PO TABS
12.5000 mg | ORAL_TABLET | Freq: Two times a day (BID) | ORAL | Status: DC
  Administered 2019-12-09 – 2019-12-10 (×2): 12.5 mg via ORAL
  Filled 2019-12-09 (×3): qty 1

## 2019-12-09 MED ORDER — ENOXAPARIN SODIUM 40 MG/0.4ML ~~LOC~~ SOLN
40.0000 mg | SUBCUTANEOUS | Status: DC
  Administered 2019-12-09 – 2019-12-12 (×4): 40 mg via SUBCUTANEOUS
  Filled 2019-12-09 (×4): qty 0.4

## 2019-12-09 MED ORDER — SODIUM CHLORIDE 0.9 % IV SOLN: 100.0000 mg | Freq: Every day | INTRAVENOUS | Status: DC

## 2019-12-09 MED ORDER — IPRATROPIUM-ALBUTEROL 20-100 MCG/ACT IN AERS
1.0000 | INHALATION_SPRAY | Freq: Four times a day (QID) | RESPIRATORY_TRACT | Status: DC
  Administered 2019-12-09 – 2019-12-12 (×12): 1 via RESPIRATORY_TRACT
  Filled 2019-12-09: qty 4

## 2019-12-09 MED ORDER — GUAIFENESIN-DM 100-10 MG/5ML PO SYRP
10.0000 mL | ORAL_SOLUTION | ORAL | Status: DC | PRN
  Administered 2019-12-12: 10 mL via ORAL
  Filled 2019-12-09: qty 10

## 2019-12-09 MED ORDER — IRBESARTAN 300 MG PO TABS
300.0000 mg | ORAL_TABLET | Freq: Every day | ORAL | Status: DC
  Administered 2019-12-09: 300 mg via ORAL
  Filled 2019-12-09: qty 1

## 2019-12-09 MED ORDER — FUROSEMIDE 10 MG/ML IJ SOLN
40.0000 mg | Freq: Once | INTRAMUSCULAR | Status: AC
  Administered 2019-12-09: 40 mg via INTRAVENOUS
  Filled 2019-12-09: qty 4

## 2019-12-09 MED ORDER — AZILSARTAN-CHLORTHALIDONE 40-12.5 MG PO TABS: 1.0000 | ORAL_TABLET | Freq: Every day | ORAL | Status: DC

## 2019-12-09 MED ORDER — SODIUM CHLORIDE 0.9 % IV SOLN
100.0000 mg | Freq: Every day | INTRAVENOUS | Status: AC
  Administered 2019-12-10 – 2019-12-13 (×4): 100 mg via INTRAVENOUS
  Filled 2019-12-09 (×4): qty 20

## 2019-12-09 MED ORDER — POTASSIUM CHLORIDE CRYS ER 20 MEQ PO TBCR
40.0000 meq | EXTENDED_RELEASE_TABLET | Freq: Once | ORAL | Status: AC
  Administered 2019-12-09: 40 meq via ORAL
  Filled 2019-12-09: qty 2

## 2019-12-09 MED ORDER — ZINC SULFATE 220 (50 ZN) MG PO CAPS
220.0000 mg | ORAL_CAPSULE | Freq: Every day | ORAL | Status: DC
  Administered 2019-12-10 – 2019-12-13 (×4): 220 mg via ORAL
  Filled 2019-12-09 (×5): qty 1

## 2019-12-09 NOTE — H&P (Signed)
History and Physical    Troy Garza DOB: [birth year] DOA: 12/09/2019  PCP: Denita Lung, MD  Patient coming from: Home  I have personally briefly reviewed patient's old medical records in [place]  Chief Complaint: Shortness of breath, weakness, fatigue, nausea, diarrhea  HPI: Troy Garza is a 59 y.o. male with medical history significant of essential hypertension, hyperlipidemia, history of DVT 2017 in which he completed anticoagulation who presents with progressive shortness of breath, weakness, fatigue nausea and diarrhea.  Patient reports recent positive Covid-19 test at an urgent care last Tuesday.  Patient also endorses poor oral intake over the past several days.  Upon EMS arrival, he was noted to be hypoxic oxygenating 83% on room air.  Denies chest pain, no palpitations, no abdominal pain, no lower extremity edema.  ED Course: Temperature 101.6, HR 88, RR 17, BP 150/93, SPO2 85% on room air, 96% on 3 L nasal cannula.  Sodium 131, potassium 2.4, chloride 95, CO2 23, BUN 17, creatinine 0.82, glucose 127.  AST 77, ALT 108, LDH 337, lactic acid 1.0, procalcitonin 0.12.  D-dimer 1.29.  Chest x-ray with cardiomegaly with bilateral interstitial prominence, small bilateral pleural effusion.  Given his hypoxic respiratory failure and rapid decline, ED physician referred patient for admission for further evaluation and treatment.  Review of Systems: As per HPI otherwise 10 point review of systems negative.    Past Medical History:  Diagnosis Date  . ED (erectile dysfunction)   . GERD (gastroesophageal reflux disease)    hx of  . Hearing loss in left ear   . Hyperlipidemia   . Hypertension   . Neuromuscular disorder ([place])    hiatal hernia  . Vertigo    left ear drum burst as child and required multipkle surgeries    Past Surgical History:  Procedure Laterality Date  . ear surgery Left   . HERNIA REPAIR  as baby  . LUMBAR DISC SURGERY  8 years ago  .  UMBILICAL HERNIA REPAIR  08/12/11     reports that he has never smoked. He has never used smokeless tobacco. He reports current alcohol use. He reports that he does not use drugs.  Allergies  Allergen Reactions  . Penicillins Hives    Did it involve swelling of the face/tongue/throat, SOB, or low BP? No Did it involve sudden or severe rash/hives, skin peeling, or any reaction on the inside of your mouth or nose? No Did you need to seek medical attention at a hospital or doctor's office? No When did it last happen?Childhood If all above answers are "NO", may proceed with cephalosporin use.    Family History  Problem Relation Age of Onset  . Hypertension Father   . Colon polyps Father   . Heart disease Father   . Brain cancer Father   . Kidney cancer Father   . Bone cancer Father   . Liver cancer Father   . Aortic dissection Mother   . Colon cancer Neg Hx     Family history reviewed and not pertinent   Prior to Admission medications   Medication Sig Start Date End Date Taking? Authorizing Provider  amLODipine (NORVASC) 5 MG tablet Take 1 tablet (5 mg total) by mouth daily. 04/29/19  Yes Denita Lung, MD  carvedilol (COREG) 12.5 MG tablet TAKE 1 TABLET (12.5 MG TOTAL) BY MOUTH 2 (TWO) TIMES DAILY. 09/13/19 12/12/19 Yes Patwardhan, Manish J, MD  EDARBYCLOR 40-12.5 MG TABS TAKE ONE TABLET BY MOUTH DAILY  Patient taking differently: Take 1 tablet by mouth at bedtime.  08/23/19  Yes Denita Lung, MD  Multiple Vitamins-Minerals (MULTIVITAMIN WITH MINERALS) tablet Take 1 tablet by mouth daily.   Yes [provider]  nitroGLYCERIN (NITROSTAT) 0.4 MG SL tablet Place 1 tablet (0.4 mg total) under the tongue every 5 (five) minutes as needed for chest pain. 04/29/19  Yes Denita Lung, MD  omeprazole (PRILOSEC) 20 MG capsule Take 20 mg by mouth daily.   Yes [provider]  vitamin C (ASCORBIC ACID) 500 MG tablet Take 500 mg by mouth daily.   Yes [provider]  zinc gluconate 50 MG tablet Take 50 mg by mouth daily.   Yes [provider]  carvedilol (COREG) 25 MG tablet Take 1 tablet (25 mg total) by mouth 2 (two) times daily with a meal. Patient not taking: Reported on 12/08/2019 06/30/19   Denita Lung, MD  rosuvastatin (CRESTOR) 20 MG tablet TAKE 1 TABLET BY MOUTH EVERY DAY Patient not taking: Reported on 12/08/2019 09/13/19   Nigel Mormon, MD  Testosterone (ANDROGEL PUMP) 20.25 MG/ACT (1.62%) GEL Place 2 Pump onto the skin daily. Patient not taking: Reported on 06/30/2019 04/30/19   Denita Lung, MD    Physical Exam: Vitals:   12/09/19 0930 12/09/19 0945 12/09/19 1000 12/09/19 1015  BP: 137/86 138/77 130/62 132/88  Pulse: 89 85 86 81  Resp: 14 19 (!) 23 11  Temp:      TempSrc:      SpO2: 96% 96% 96% 97%  Weight:      Height:        Constitutional: NAD, calm, comfortable, slightly ill in appearance Eyes: PERRL, lids and conjunctivae normal ENMT: Mucous membranes are dry. Posterior pharynx clear of any exudate or lesions.Normal dentition.  Neck: normal, supple, no masses, no thyromegaly Respiratory: clear to auscultation bilaterally, no wheezing, no crackles. Normal respiratory effort. No accessory muscle use.  Oxygenating 96% on 3 L nasal cannula Cardiovascular: Regular rate and rhythm, no murmurs / rubs / gallops. No extremity edema. 2+ pedal pulses. No carotid bruits.  Abdomen: no tenderness, no masses palpated. No hepatosplenomegaly. Bowel sounds positive.  Musculoskeletal: no clubbing / cyanosis. No joint deformity upper and lower extremities. Good ROM, no contractures. Normal muscle tone.  Skin: no rashes, lesions, ulcers. No induration Neurologic: CN 2-12 grossly intact. Sensation intact, DTR normal. Strength 5/5 in all 4.  Psychiatric: Normal judgment and insight. Alert and oriented x 3. Normal mood.    Labs on Admission: I have personally reviewed following labs and imaging  studies  CBC: Recent Labs  Lab 12/09/19 0910  WBC 8.7  NEUTROABS 6.8  HGB 15.5  HCT 43.7  MCV 81.2  PLT 0000000   Basic Metabolic Panel: Recent Labs  Lab 12/09/19 0910  NA 131*  K 2.4*  CL 95*  CO2 23  GLUCOSE 127*  BUN 17  CREATININE 0.82  CALCIUM 8.1*  MG 2.2   GFR: Estimated Creatinine Clearance: 108.7 mL/min (by C-G formula based on SCr of 0.82 mg/dL). Liver Function Tests: Recent Labs  Lab 12/09/19 0910  AST 77*  ALT 108*  ALKPHOS 60  BILITOT 1.3*  PROT 7.3  ALBUMIN 3.6   No results for input(s): LIPASE, AMYLASE in the last 168 hours. No results for input(s): AMMONIA in the last 168 hours. Coagulation Profile: No results for input(s): INR, PROTIME in the last 168 hours. Cardiac Enzymes: No results for input(s): CKTOTAL, CKMB, CKMBINDEX, TROPONINI in  the last 168 hours. BNP (last 3 results) No results for input(s): PROBNP in the last 8760 hours. HbA1C: No results for input(s): HGBA1C in the last 72 hours. CBG: No results for input(s): GLUCAP in the last 168 hours. Lipid Profile: Recent Labs    12/09/19 0910  TRIG 79   Thyroid Function Tests: No results for input(s): TSH, T4TOTAL, FREET4, T3FREE, THYROIDAB in the last 72 hours. Anemia Panel: Recent Labs    12/09/19 0910  FERRITIN 1,349*   Urine analysis: No results found for: COLORURINE, APPEARANCEUR, LABSPEC, PHURINE, GLUCOSEU, HGBUR, BILIRUBINUR, KETONESUR, PROTEINUR, UROBILINOGEN, NITRITE, LEUKOCYTESUR  Radiological Exams on Admission: DG Chest Port 1 View  Result Date: 12/09/2019 CLINICAL DATA:  Shortness of breath. EXAM: PORTABLE CHEST 1 VIEW COMPARISON:  Chest x-ray 12/29/2017. FINDINGS: Mediastinum hilar structures normal. Cardiomegaly. Diffuse bilateral interstitial prominence. Interstitial edema and/or pneumonitis could present in this fashion. Small bilateral pleural effusions. No pneumothorax. IMPRESSION: Cardiomegaly with bilateral interstitial prominence and small bilateral pleural  effusions. Findings suggest CHF. Pneumonitis cannot be excluded. Electronically Signed   By: Marcello Moores  Register   On: 12/09/2019 09:35    EKG: Independently reviewed.   Assessment/Plan Principal Problem:   Pneumonia due to COVID-19 virus Active Problems:   Hypertension   Elevated LFTs   Elevated d-dimer   Hyperlipidemia   Hyponatremia   Hypokalemia    Acute hypoxic respiratory failure secondary to acute Covid-19 viral pneumonia during the ongoing  Covid 19 Pandemic - POA Patient presenting via EMS with progressive shortness of breath, weakness, fatigue, nausea and diarrhea.  Reports tested positive for Covid-19 at an urgent care on Tuesday.  Chest x-ray with findings consistent with a multifocal pneumonia.  Hypoxic, oxygenating 83% on room air on presentation.  LDH elevated 337 and D-dimer 1.29. --Remdesivir, plan 5-day course --Decadron 6 mg p.o. daily --Check CRP, if greater than 7.0, will initiate Actemra --Continue supplemental oxygen, titrate to maintain SPO2 greater than 92% --Continue supportive care with vitamin C, zinc, Tylenol --Follow CBC, CMP, D-dimer, ferritin, and CRP daily --Continue airborne/contact isolation precautions  Hyponatremia Sodium 131 on admission.  Likely secondary to hypovolemic hyponatremia secondary to poor oral intake. --Will ive gentle IV fluid bolus --Encourage increased oral intake --Repeat CMP in a.m.  Hypokalemia --Potassium 2.5 on admission, likely secondary to poor oral intake in the days preceding hospitalization.  Repleted in the ED. --Repeat electrolytes in a.m. to include magnesium  Elevated LFTs AST 77, ALT 108.  Denies EtOH use. --Avoid hepatotoxins --Continue to trend CMP daily  Essential hypertension BP 150/93 --Continue home amlodipine 5 mg p.o. daily, carvedilol 12.5 mg p.o. twice daily, and Azilsartan-chlorathalidone 40-12.5mg  PO daily  Hyperlipidemia: Currently not taking a statin   DVT prophylaxis: Lovenox Code  Status: Full code Family Communication: Discussed extensively with patient at bedside Disposition Plan: Anticipate discharge home once titrated off of supplemental oxygen Consults called: None Admission status: Inpatient, telemetry   Severity of Illness: The appropriate patient status for this patient is INPATIENT. Inpatient status is judged to be reasonable and necessary in order to provide the required intensity of service to ensure the patient's safety. The patient's presenting symptoms, physical exam findings, and initial radiographic and laboratory data in the context of their chronic comorbidities is felt to place them at high risk for further clinical deterioration. Furthermore, it is not anticipated that the patient will be medically stable for discharge from the hospital within 2 midnights of admission. The following factors support the patient status of inpatient.   " The  patient's presenting symptoms include dyspnea, weakness, fatigue, nausea, diarrhea " The worrisome physical exam findings include shortness of breath, hypoxic on room air " The initial radiographic and laboratory data are worrisome because of elevated LDH, elevated D-dimer, chest x-ray with findings consistent of a multifocal pneumonia " The chronic co-morbidities include hypertension, hyperlipidemia.   * I certify that at the point of admission it is my clinical judgment that the patient will require inpatient hospital care spanning beyond 2 midnights from the point of admission due to high intensity of service, high risk for further deterioration and high frequency of surveillance required.*    Joby Richart J [place] DO [facility] Available via Epic secure chat 7am-7pm After these hours, please refer to coverage provider listed on [email or web address] 12/09/2019, 11:15 AM

## 2019-12-09 NOTE — ED Notes (Signed)
Date and time results received: 12/09/19 1003 (use smartphrase ".now" to insert current time)  Test: Potassium  Critical Value: 2.4  Name of Provider Notified: Tyrone Nine MD   Orders Received? Or Actions Taken?:

## 2019-12-09 NOTE — Plan of Care (Signed)
  Problem: Education: Goal: Knowledge of General Education information will improve Description: Including pain rating scale, medication(s)/side effects and non-pharmacologic comfort measures Outcome: Progressing   Problem: Health Behavior/Discharge Planning: Goal: Ability to manage health-related needs will improve Outcome: Progressing   Problem: Clinical Measurements: Goal: Ability to maintain clinical measurements within normal limits will improve Outcome: Progressing Goal: Will remain free from infection Outcome: Progressing Goal: Diagnostic test results will improve Outcome: Progressing Goal: Respiratory complications will improve Outcome: Progressing Goal: Cardiovascular complication will be avoided Outcome: Progressing   Problem: Activity: Goal: Risk for activity intolerance will decrease Outcome: Progressing   Problem: Nutrition: Goal: Adequate nutrition will be maintained Outcome: Progressing   Problem: Coping: Goal: Level of anxiety will decrease Outcome: Progressing   Problem: Elimination: Goal: Will not experience complications related to bowel motility Outcome: Progressing Goal: Will not experience complications related to urinary retention Outcome: Progressing   Problem: Pain Managment: Goal: General experience of comfort will improve Outcome: Progressing   Problem: Safety: Goal: Ability to remain free from injury will improve Outcome: Progressing   Problem: Skin Integrity: Goal: Risk for impaired skin integrity will decrease Outcome: Progressing   Problem: Education: Goal: Knowledge of risk factors and measures for prevention of condition will improve Outcome: Progressing   Problem: Coping: Goal: Psychosocial and spiritual needs will be supported Outcome: Progressing   Problem: Respiratory: Goal: Will maintain a patent airway Outcome: Progressing Goal: Complications related to the disease process, condition or treatment will be avoided or  minimized Outcome: Progressing   Problem: Activity: Goal: Ability to tolerate increased activity will improve Outcome: Progressing   Problem: Clinical Measurements: Goal: Ability to maintain a body temperature in the normal range will improve Outcome: Progressing   Problem: Respiratory: Goal: Ability to maintain adequate ventilation will improve Outcome: Progressing Goal: Ability to maintain a clear airway will improve Outcome: Progressing

## 2019-12-09 NOTE — ED Triage Notes (Signed)
Per EMS: Patient is coming from home. Patient reports testing COVID + last Tuesday. Patient is having SOB and is on 3L of oxygen. Upon exertion patient went down to 85% on RA with EMS, that is when they placed patient on 3L

## 2019-12-09 NOTE — Progress Notes (Signed)
Updated patient's brother, Saralyn Pilar, via telephone on plan of care.

## 2019-12-09 NOTE — Progress Notes (Signed)
CRITICAL VALUE ALERT  Critical Value:  Lactic Acid 2.5  Date & Time Notied:  12/09/2019 1505  Provider Notified: Dr. [place]  Orders Received/Actions taken:

## 2019-12-09 NOTE — ED Provider Notes (Signed)
[facility] DEPT Provider Note   CSN: [id number] Arrival date & time: 12/09/19  0850     History Chief Complaint  Patient presents with  . COVID Positive  . Shortness of Breath    Troy Garza is a 59 y.o. male.  59 yo M with a chief complaints of shortness of breath.  Patient having cough congestion myalgias nausea going on for about 9 days.  Patient was diagnosed with the novel coronavirus at an urgent care locally.  States he has been getting progressively worse.  Denies any abdominal pain denies chest pain.  The history is provided by the patient.  Shortness of Breath Severity:  Moderate Onset quality:  Gradual Duration:  9 days Timing:  Constant Progression:  Worsening Chronicity:  New Relieved by:  Nothing Worsened by:  Nothing Ineffective treatments:  None tried Associated symptoms: cough and fever   Associated symptoms: no abdominal pain, no chest pain, no headaches, no rash and no vomiting        Past Medical History:  Diagnosis Date  . ED (erectile dysfunction)   . GERD (gastroesophageal reflux disease)    hx of  . Hearing loss in left ear   . Hyperlipidemia   . Hypertension   . Neuromuscular disorder ([place])    hiatal hernia  . 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 Angina pectoris ([place]) 05/19/2019  . Family history of factor V Leiden mutation 07/24/2016  . History of tympanoplasty of left ear 09/03/2015  . ED (erectile dysfunction) 07/26/2012  . Hypertension 06/24/2012    Past Surgical History:  Procedure Laterality Date  . ear surgery Left   . HERNIA REPAIR  as baby  . LUMBAR DISC SURGERY  8 years ago  . UMBILICAL HERNIA REPAIR  08/12/11       Family History  Problem Relation Age of Onset  . Hypertension Father   . Colon polyps Father   . Heart disease Father   . Brain cancer Father   . Kidney cancer Father   .  Bone cancer Father   . Liver cancer Father   . Aortic dissection Mother   . Colon cancer Neg Hx     Social History   Tobacco Use  . Smoking status: Never Smoker  . Smokeless tobacco: Never Used  Substance Use Topics  . Alcohol use: Yes    Alcohol/week: 0.0 standard drinks    Comment: socially  . Drug use: No    Home Medications Prior to Admission medications   Medication Sig Start Date End Date Taking? Authorizing Provider  amLODipine (NORVASC) 5 MG tablet Take 1 tablet (5 mg total) by mouth daily. 04/29/19  Yes Denita Lung, MD  carvedilol (COREG) 12.5 MG tablet TAKE 1 TABLET (12.5 MG TOTAL) BY MOUTH 2 (TWO) TIMES DAILY. 09/13/19 12/12/19 Yes Patwardhan, Manish J, MD  EDARBYCLOR 40-12.5 MG TABS TAKE ONE TABLET BY MOUTH DAILY  Patient taking differently: Take 1 tablet by mouth at bedtime.  08/23/19  Yes Denita Lung, MD  Multiple Vitamins-Minerals (MULTIVITAMIN WITH MINERALS) tablet Take 1 tablet by mouth daily.   Yes [provider]  nitroGLYCERIN (NITROSTAT) 0.4 MG SL tablet Place 1 tablet (0.4 mg total) under the tongue every 5 (five) minutes as needed for chest pain. 04/29/19  Yes Denita Lung, MD  omeprazole (PRILOSEC) 20 MG capsule Take 20 mg  by mouth daily.   Yes [provider]  vitamin C (ASCORBIC ACID) 500 MG tablet Take 500 mg by mouth daily.   Yes [provider]  zinc gluconate 50 MG tablet Take 50 mg by mouth daily.   Yes [provider]  carvedilol (COREG) 25 MG tablet Take 1 tablet (25 mg total) by mouth 2 (two) times daily with a meal. Patient not taking: Reported on 12/08/2019 06/30/19   Denita Lung, MD  rosuvastatin (CRESTOR) 20 MG tablet TAKE 1 TABLET BY MOUTH EVERY DAY Patient not taking: Reported on 12/08/2019 09/13/19   Nigel Mormon, MD  Testosterone (ANDROGEL PUMP) 20.25 MG/ACT (1.62%) GEL Place 2 Pump onto the skin daily. Patient not taking: Reported on 06/30/2019 04/30/19   Denita Lung, MD     Allergies    Penicillins  Review of Systems   Review of Systems  Constitutional: Positive for chills and fever.  HENT: Negative for congestion and facial swelling.   Eyes: Negative for discharge and visual disturbance.  Respiratory: Positive for cough and shortness of breath.   Cardiovascular: Negative for chest pain and palpitations.  Gastrointestinal: Positive for nausea. Negative for abdominal pain, diarrhea and vomiting.  Musculoskeletal: Positive for myalgias. Negative for arthralgias.  Skin: Negative for color change and rash.  Neurological: Negative for tremors, syncope and headaches.  Psychiatric/Behavioral: Negative for confusion and dysphoric mood.  5  Physical Exam Updated Vital Signs BP (!) 150/93 (BP Location: Left Arm)   Pulse 88   Temp (!) 101.6 F (38.7 C) (Oral)   Resp 17   Ht 5\' 8"  (1.727 m)   Wt 90.7 kg   SpO2 96%   BMI 30.41 kg/m   Physical Exam Vitals and nursing note reviewed.  Constitutional:      Appearance: He is well-developed.  HENT:     Head: Normocephalic and atraumatic.  Eyes:     Pupils: Pupils are equal, round, and reactive to light.  Neck:     Vascular: No JVD.  Cardiovascular:     Rate and Rhythm: Normal rate and regular rhythm.     Heart sounds: No murmur. No friction rub. No gallop.   Pulmonary:     Effort: Tachypnea present. No respiratory distress.     Breath sounds: No wheezing.  Abdominal:     General: There is no distension.     Tenderness: There is no abdominal tenderness. There is no guarding or rebound.     Comments: Benign abdominal exam  Musculoskeletal:        General: Normal range of motion.     Cervical back: Normal range of motion and neck supple.  Skin:    Coloration: Skin is not pale.     Findings: No rash.  Neurological:     Mental Status: He is alert and oriented to person, place, and time.  Psychiatric:        Behavior: Behavior normal.     ED Results / Procedures / Treatments   Labs (all  labs ordered are listed, but only abnormal results are displayed) Labs Reviewed  COMPREHENSIVE METABOLIC PANEL - Abnormal; Notable for the following components:      Result Value   Sodium 131 (*)    Potassium 2.4 (*)    Chloride 95 (*)    Glucose, Bld 127 (*)    Calcium 8.1 (*)    AST 77 (*)    ALT 108 (*)    Total Bilirubin 1.3 (*)  All other components within normal limits  D-DIMER, QUANTITATIVE (NOT AT [facility]) - Abnormal; Notable for the following components:   D-Dimer, Quant 1.29 (*)    All other components within normal limits  LACTATE DEHYDROGENASE - Abnormal; Notable for the following components:   LDH 337 (*)    All other components within normal limits  FIBRINOGEN - Abnormal; Notable for the following components:   Fibrinogen 623 (*)    All other components within normal limits  CULTURE, BLOOD (ROUTINE X 2)  CULTURE, BLOOD (ROUTINE X 2)  LACTIC ACID, PLASMA  CBC WITH DIFFERENTIAL/PLATELET  LACTIC ACID, PLASMA  PROCALCITONIN  FERRITIN  TRIGLYCERIDES  C-REACTIVE PROTEIN  MAGNESIUM  POC SARS CORONAVIRUS 2 AG -  ED    EKG EKG Interpretation  Date/Time:  Friday December 09 2019 09:05:36 EST Ventricular Rate:  89 PR Interval:    QRS Duration: 102 QT Interval:  437 QTC Calculation: 532 R Axis:   15 Text Interpretation: Sinus rhythm Probable left atrial enlargement Low voltage, precordial leads Nonspecific repol abnormality, diffuse leads Prolonged QT interval Baseline wander TECHNICALLY DIFFICULT Otherwise no significant change Confirmed by Deno Etienne [phone]) on 12/09/2019 9:11:00 AM   Radiology DG Chest Port 1 View  Result Date: 12/09/2019 CLINICAL DATA:  Shortness of breath. EXAM: PORTABLE CHEST 1 VIEW COMPARISON:  Chest x-ray 12/29/2017. FINDINGS: Mediastinum hilar structures normal. Cardiomegaly. Diffuse bilateral interstitial prominence. Interstitial edema and/or pneumonitis could present in this fashion. Small bilateral pleural effusions. No pneumothorax.  IMPRESSION: Cardiomegaly with bilateral interstitial prominence and small bilateral pleural effusions. Findings suggest CHF. Pneumonitis cannot be excluded. Electronically Signed   By: Marcello Moores  Register   On: 12/09/2019 09:35    Procedures Procedures (including critical care time)  Medications Ordered in ED Medications  potassium chloride SA (KLOR-CON) CR tablet 40 mEq (has no administration in time range)  magnesium oxide (MAG-OX) tablet 800 mg (has no administration in time range)  potassium chloride 10 mEq in 100 mL IVPB (has no administration in time range)  chlorpheniramine-HYDROcodone (TUSSIONEX) 10-8 MG/5ML suspension 5 mL (5 mLs Oral Given 12/09/19 0932)  ketorolac (TORADOL) 15 MG/ML injection 15 mg (15 mg Intravenous Given 12/09/19 0933)  ondansetron (ZOFRAN) injection 4 mg (4 mg Intravenous Given 12/09/19 0933)    ED Course  I have reviewed the triage vital signs and the nursing notes.  Pertinent labs & imaging results that were available during my care of the patient were reviewed by me and considered in my medical decision making (see chart for details).    MDM Rules/Calculators/A&P                      59 yo M with a chief complaints of shortness of breath.  Patient was diagnosed with the novel coronavirus about a week ago.  Has had progressive worsening.  Hypoxic on ambulation with EMS down into the low 80s.  Improved with nasal cannula.  Not initially requiring oxygen at rest here.  Will obtain a laboratory evaluation for likely admission.  Patient's potassium is 2.4.  He does have a prolonged QT on his EKG.  We will give oral and IV replenishment.  Will discuss with medicine for admission.  CRITICAL CARE Performed by: Cecilio Asper   Total critical care time: 35 minutes  Critical care time was exclusive of separately billable procedures and treating other patients.  Critical care was necessary to treat or prevent imminent or life-threatening  deterioration.  Critical care was time spent personally by  me on the following activities: development of treatment plan with patient and/or surrogate as well as nursing, discussions with consultants, evaluation of patient's response to treatment, examination of patient, obtaining history from patient or surrogate, ordering and performing treatments and interventions, ordering and review of laboratory studies, ordering and review of radiographic studies, pulse oximetry and re-evaluation of patient's condition.  The patients results and plan were reviewed and discussed.   Any x-rays performed were independently reviewed by myself.   Differential diagnosis were considered with the presenting HPI.  Medications  potassium chloride SA (KLOR-CON) CR tablet 40 mEq (has no administration in time range)  magnesium oxide (MAG-OX) tablet 800 mg (has no administration in time range)  potassium chloride 10 mEq in 100 mL IVPB (has no administration in time range)  chlorpheniramine-HYDROcodone (TUSSIONEX) 10-8 MG/5ML suspension 5 mL (5 mLs Oral Given 12/09/19 0932)  ketorolac (TORADOL) 15 MG/ML injection 15 mg (15 mg Intravenous Given 12/09/19 0933)  ondansetron (ZOFRAN) injection 4 mg (4 mg Intravenous Given 12/09/19 0933)    Vitals:   12/09/19 0904 12/09/19 0906 12/09/19 0907  BP: (!) 150/93    Pulse: 88    Resp: 17    Temp: (!) 101.6 F (38.7 C)    TempSrc: Oral    SpO2: 94% 96%   Weight:   90.7 kg  Height:   5\' 8"  (1.727 m)    Final diagnoses:  COVID-19 virus infection  Acute respiratory failure with hypoxia (HCC)  Hypokalemia    Admission/ observation were discussed with the admitting physician, patient and/or family and they are comfortable with the plan.    Final Clinical Impression(s) / ED Diagnoses Final diagnoses:  COVID-19 virus infection  Acute respiratory failure with hypoxia ([place])  Hypokalemia    Rx / DC Orders ED Discharge Orders    None       Deno Etienne,  DO 12/09/19 1009

## 2019-12-10 DIAGNOSIS — J9601 Acute respiratory failure with hypoxia: Secondary | ICD-10-CM

## 2019-12-10 DIAGNOSIS — R7989 Other specified abnormal findings of blood chemistry: Secondary | ICD-10-CM

## 2019-12-10 DIAGNOSIS — E876 Hypokalemia: Secondary | ICD-10-CM

## 2019-12-10 DIAGNOSIS — E871 Hypo-osmolality and hyponatremia: Secondary | ICD-10-CM

## 2019-12-10 DIAGNOSIS — I9589 Other hypotension: Secondary | ICD-10-CM

## 2019-12-10 LAB — COMPREHENSIVE METABOLIC PANEL
ALT: 94 U/L — ABNORMAL HIGH (ref 0–44)
AST: 63 U/L — ABNORMAL HIGH (ref 15–41)
Albumin: 3.2 g/dL — ABNORMAL LOW (ref 3.5–5.0)
Alkaline Phosphatase: 52 U/L (ref 38–126)
Anion gap: 10 (ref 5–15)
BUN: 23 mg/dL — ABNORMAL HIGH (ref 6–20)
CO2: 24 mmol/L (ref 22–32)
Calcium: 8 mg/dL — ABNORMAL LOW (ref 8.9–10.3)
Chloride: 99 mmol/L (ref 98–111)
Creatinine, Ser: 0.89 mg/dL (ref 0.61–1.24)
GFR calc Af Amer: >60 mL/min (ref >60)
GFR calc non Af Amer: >60 mL/min (ref >60)
Glucose, Bld: 164 mg/dL — ABNORMAL HIGH (ref 70–99)
Potassium: 3.6 mmol/L (ref 3.5–5.1)
Sodium: 133 mmol/L — ABNORMAL LOW (ref 135–145)
Total Bilirubin: 1 mg/dL (ref 0.3–1.2)
Total Protein: 6.6 g/dL (ref 6.5–8.1)

## 2019-12-10 LAB — CBC WITH DIFFERENTIAL/PLATELET
Abs Immature Granulocytes: 0.06 10*3/uL (ref 0.00–0.07)
Basophils Absolute: 0 10*3/uL (ref 0.0–0.1)
Basophils Relative: 0 %
Eosinophils Absolute: 0 10*3/uL (ref 0.0–0.5)
Eosinophils Relative: 0 %
HCT: 44.5 % (ref 39.0–52.0)
Hemoglobin: 15 g/dL (ref 13.0–17.0)
Immature Granulocytes: 1 %
Lymphocytes Relative: 12 %
Lymphs Abs: 1.1 10*3/uL (ref 0.7–4.0)
MCH: 28.9 pg (ref 26.0–34.0)
MCHC: 33.7 g/dL (ref 30.0–36.0)
MCV: 85.7 fL (ref 80.0–100.0)
Monocytes Absolute: 0.5 10*3/uL (ref 0.1–1.0)
Monocytes Relative: 6 %
Neutro Abs: 7 10*3/uL (ref 1.7–7.7)
Neutrophils Relative %: 81 %
Platelets: 218 10*3/uL (ref 150–400)
RBC: 5.19 MIL/uL (ref 4.22–5.81)
RDW: 13.2 % (ref 11.5–15.5)
WBC: 8.7 10*3/uL (ref 4.0–10.5)
nRBC: 0 % (ref 0.0–0.2)

## 2019-12-10 LAB — PROCALCITONIN: Procalcitonin: 0.16 ng/mL

## 2019-12-10 LAB — C-REACTIVE PROTEIN: CRP: 7.1 mg/dL — ABNORMAL HIGH (ref <1.0)

## 2019-12-10 LAB — FERRITIN: Ferritin: 1449 ng/mL — ABNORMAL HIGH (ref 24–336)

## 2019-12-10 LAB — D-DIMER, QUANTITATIVE: D-Dimer, Quant: 1.19 ug/mL-FEU — ABNORMAL HIGH (ref 0.00–0.50)

## 2019-12-10 LAB — MAGNESIUM: Magnesium: 2.5 mg/dL — ABNORMAL HIGH (ref 1.7–2.4)

## 2019-12-10 MED ORDER — TOCILIZUMAB 400 MG/20ML IV SOLN
720.0000 mg | Freq: Once | INTRAVENOUS | Status: AC
  Administered 2019-12-10: 720 mg via INTRAVENOUS
  Filled 2019-12-10: qty 36

## 2019-12-10 MED ORDER — SODIUM CHLORIDE 0.9 % IV BOLUS
500.0000 mL | Freq: Once | INTRAVENOUS | Status: AC
  Administered 2019-12-10: 500 mL via INTRAVENOUS

## 2019-12-10 NOTE — Progress Notes (Signed)
Patient has had 3 episodes of liquid stools. WBC is WNL, pt is afebrile, on no laxatives, no c/o abdominal pain and is receiving antibiotic therapy. Md notified. Eulas Post, RN

## 2019-12-10 NOTE — Progress Notes (Signed)
Attempted to call brother, Ronalee Belts, and got voicemail. RN did speak with brother, Saralyn Pilar, and answered his questions to the best of my ability. Eulas Post, RN

## 2019-12-10 NOTE — Plan of Care (Signed)
  Problem: Education: Goal: Knowledge of General Education information will improve Description: Including pain rating scale, medication(s)/side effects and non-pharmacologic comfort measures Outcome: Progressing   Problem: Health Behavior/Discharge Planning: Goal: Ability to manage health-related needs will improve Outcome: Progressing   Problem: Clinical Measurements: Goal: Ability to maintain clinical measurements within normal limits will improve Outcome: Progressing Goal: Will remain free from infection Outcome: Progressing Goal: Diagnostic test results will improve Outcome: Progressing Goal: Respiratory complications will improve Outcome: Progressing Goal: Cardiovascular complication will be avoided Outcome: Progressing   Problem: Activity: Goal: Risk for activity intolerance will decrease Outcome: Progressing   Problem: Nutrition: Goal: Adequate nutrition will be maintained Outcome: Progressing   Problem: Coping: Goal: Level of anxiety will decrease Outcome: Progressing   Problem: Elimination: Goal: Will not experience complications related to bowel motility Outcome: Progressing Goal: Will not experience complications related to urinary retention Outcome: Progressing   Problem: Pain Managment: Goal: General experience of comfort will improve Outcome: Progressing   Problem: Safety: Goal: Ability to remain free from injury will improve Outcome: Progressing   Problem: Skin Integrity: Goal: Risk for impaired skin integrity will decrease Outcome: Progressing   Problem: Education: Goal: Knowledge of risk factors and measures for prevention of condition will improve Outcome: Progressing   Problem: Coping: Goal: Psychosocial and spiritual needs will be supported Outcome: Progressing   Problem: Respiratory: Goal: Will maintain a patent airway Outcome: Progressing Goal: Complications related to the disease process, condition or treatment will be avoided or  minimized Outcome: Progressing   Problem: Activity: Goal: Ability to tolerate increased activity will improve Outcome: Progressing   Problem: Clinical Measurements: Goal: Ability to maintain a body temperature in the normal range will improve Outcome: Progressing   Problem: Respiratory: Goal: Ability to maintain adequate ventilation will improve Outcome: Progressing Goal: Ability to maintain a clear airway will improve Outcome: Progressing

## 2019-12-10 NOTE — Progress Notes (Signed)
PROGRESS NOTE    Troy Garza   [id number]  DOB: 04-17-1961  DOA: 12/09/2019 PCP: Denita Lung, MD   Brief Narrative:  Troy Garza is a 59 y.o. male with medical history significant of essential hypertension, hyperlipidemia, history of DVT 2017 in which he completed anticoagulation who presents with progressive shortness of breath, weakness, fatigue nausea and diarrhea.  Patient reports recent positive Covid-19 test at an urgent care last Tuesday.  Patient also endorses poor oral intake over the past several days.   Upon EMS arrival, he was noted to be hypoxic oxygenating 83% on room air.   In ED: Temperature 101.6, pulse ox 96 percent on 3 L of oxygen, sodium 131, potassium 2.4, LFTs elevated. Chest x-ray revealed bilateral interstitial prominence with small bilateral pleural effusions. Patient was started on remdesivir, Decadron, IV fluids and admitted to the hospital.  Subjective: He feels very lightheaded and dizzy when he stands.  He has mild shortness of breath and cough.    Assessment & Plan:   Principal Problem: Sepsis, acute respiratory failure and pneumonia due to COVID-19 virus -Continue IV steroids, remdesivir, vitamin C, zinc and other supportive measures including oxygen and inhalers -Wean oxygen as able-he remains on 3 L today -CRP is elevated at 7.1 today, I have discussed giving him Actemra and contraindications and side effects have been explained to him in detail along with the off label use for COVID 19 infection- He has given permission to receive the medication and I have ordered the infusion   Active Problems: Dizziness, hyponatremia, hypotension, lactic acidosis - Lactic acid 2.5 - BP 106/67 this AM, BUN Cr ratio > 20 today, dizzy when ambulating in room - will give a NS bolus of 500 cc- he has no h/o CHF - BNP is 58 - holding Chlorthalidone, Avapro, Amlodipine and Coreg     Elevated LFTs - mild elevation likely in relation to COVID 19 infection-  improved today- cont to follow - hold Crestor for now     Hypokalemia - K 2.4 on admission has improved to 3.6 with replacement  Obesity Body mass index is 30.41 kg/m.   Time spent in minutes: 35 DVT prophylaxis: Lovenox Code Status: Full code Family Communication:  Disposition Plan: home Consultants:   none Procedures:   none Antimicrobials:  Anti-infectives (From admission, onward)   Start     Dose/Rate Route Frequency Ordered Stop   12/10/19 1000  remdesivir 100 mg in sodium chloride 0.9 % 100 mL IVPB  Status:  Discontinued     100 mg 200 mL/hr over 30 Minutes Intravenous Daily 12/09/19 1353 12/09/19 1357   12/10/19 1000  remdesivir 100 mg in sodium chloride 0.9 % 100 mL IVPB     100 mg 200 mL/hr over 30 Minutes Intravenous Daily 12/09/19 1037 12/14/19 0959   12/09/19 1352  remdesivir 200 mg in sodium chloride 0.9% 250 mL IVPB  Status:  Discontinued     200 mg 580 mL/hr over 30 Minutes Intravenous Once 12/09/19 1353 12/09/19 1357   12/09/19 1200  remdesivir 200 mg in sodium chloride 0.9% 250 mL IVPB     200 mg 580 mL/hr over 30 Minutes Intravenous Once 12/09/19 1037 12/09/19 1335       Objective: Vitals:   12/09/19 2207 12/10/19 0522 12/10/19 0549 12/10/19 1015  BP: (!) 142/85  106/67 112/69  Pulse: 73  64 68  Resp:   18   Temp: 98.7 F (37.1 C)  98.3 F (36.8 C)   TempSrc:  Oral  Oral   SpO2: 94% 92% 92%   Weight:      Height:        Intake/Output Summary (Last 24 hours) at 12/10/2019 1041 Last data filed at 12/10/2019 0522 Gross per 24 hour  Intake 1338.75 ml  Output 1525 ml  Net -186.25 ml   Filed Weights   12/09/19 0907  Weight: 90.7 kg    Examination: General exam: Appears comfortable  HEENT: PERRLA, oral mucosa moist, no sclera icterus or thrush Respiratory system: faint crackles at bases- pulse ox in low 90s on 3 L O2. Respiratory rate in 20s Cardiovascular system: S1 & S2 heard, RRR.   Gastrointestinal system: Abdomen soft, non-tender,  nondistended. Normal bowel sounds. Central nervous system: Alert and oriented. No focal neurological deficits. Extremities: No cyanosis, clubbing or edema Skin: No rashes or ulcers Psychiatry:  Mood & affect appropriate.     Data Reviewed: I have personally reviewed following labs and imaging studies  CBC: Recent Labs  Lab 12/09/19 0910 12/09/19 1416 12/10/19 0253  WBC 8.7 10.4 8.7  NEUTROABS 6.8  --  7.0  HGB 15.5 15.6 15.0  HCT 43.7 45.6 44.5  MCV 81.2 84.0 85.7  PLT 188 212 [id number]   Basic Metabolic Panel: Recent Labs  Lab 12/09/19 0910 12/09/19 1416 12/10/19 0253  NA 131*  --  133*  K 2.4*  --  3.6  CL 95*  --  99  CO2 23  --  24  GLUCOSE 127*  --  164*  BUN 17  --  23*  CREATININE 0.82 1.16 0.89  CALCIUM 8.1*  --  8.0*  MG 2.2  --  2.5*   GFR: Estimated Creatinine Clearance: 100.1 mL/min (by C-G formula based on SCr of 0.89 mg/dL). Liver Function Tests: Recent Labs  Lab 12/09/19 0910 12/10/19 0253  AST 77* 63*  ALT 108* 94*  ALKPHOS 60 52  BILITOT 1.3* 1.0  PROT 7.3 6.6  ALBUMIN 3.6 3.2*   No results for input(s): LIPASE, AMYLASE in the last 168 hours. No results for input(s): AMMONIA in the last 168 hours. Coagulation Profile: No results for input(s): INR, PROTIME in the last 168 hours. Cardiac Enzymes: No results for input(s): CKTOTAL, CKMB, CKMBINDEX, TROPONINI in the last 168 hours. BNP (last 3 results) No results for input(s): PROBNP in the last 8760 hours. HbA1C: No results for input(s): HGBA1C in the last 72 hours. CBG: No results for input(s): GLUCAP in the last 168 hours. Lipid Profile: Recent Labs    12/09/19 0910  TRIG 79   Thyroid Function Tests: No results for input(s): TSH, T4TOTAL, FREET4, T3FREE, THYROIDAB in the last 72 hours. Anemia Panel: Recent Labs    12/09/19 0910 12/10/19 0253  FERRITIN 1,349* 1,449*   Urine analysis: No results found for: COLORURINE, APPEARANCEUR, LABSPEC, PHURINE, GLUCOSEU, HGBUR, BILIRUBINUR,  KETONESUR, PROTEINUR, UROBILINOGEN, NITRITE, LEUKOCYTESUR Sepsis Labs: @LABRCNTIP (procalcitonin:4,lacticidven:4) ) Recent Results (from the past 240 hour(s))  Blood Culture (routine x 2)     Status: None (Preliminary result)   Collection Time: 12/09/19  9:10 AM   Specimen: BLOOD RIGHT HAND  Result Value Ref Range Status   Specimen Description   Final    BLOOD RIGHT HAND Performed at [facility] [address], [place], [place] [address]    Special Requests   Final    BOTTLES DRAWN AEROBIC AND ANAEROBIC Blood Culture adequate volume Performed at [facility] [address], [place], [place] [address]    Culture  Final    NO GROWTH < 24 HOURS Performed at [facility], [place] [address] [place], [place] [address]    Report Status PENDING  Incomplete  Blood Culture (routine x 2)     Status: None (Preliminary result)   Collection Time: 12/09/19  9:15 AM   Specimen: BLOOD  Result Value Ref Range Status   Specimen Description   Final    BLOOD RIGHT ANTECUBITAL Performed at [facility] [address], [place], [place] [address]    Special Requests   Final    BOTTLES DRAWN AEROBIC AND ANAEROBIC Blood Culture adequate volume Performed at [facility] [address], [place], [place] [address]    Culture   Final    NO GROWTH < 24 HOURS Performed at [facility] [address] [place], [place] [address]    Report Status PENDING  Incomplete         Radiology Studies: DG Chest Port 1 View  Result Date: 12/09/2019 CLINICAL DATA:  Shortness of breath. EXAM: PORTABLE CHEST 1 VIEW COMPARISON:  Chest x-ray 12/29/2017. FINDINGS: Mediastinum hilar structures normal. Cardiomegaly. Diffuse bilateral interstitial prominence. Interstitial edema and/or pneumonitis could present in this fashion. Small bilateral pleural effusions. No pneumothorax. IMPRESSION: Cardiomegaly with bilateral interstitial  prominence and small bilateral pleural effusions. Findings suggest CHF. Pneumonitis cannot be excluded. Electronically Signed   By: Marcello Moores  Register   On: 12/09/2019 09:35      Scheduled Meds: . amLODipine  5 mg Oral Daily  . vitamin C  500 mg Oral Daily  . carvedilol  12.5 mg Oral BID  . dexamethasone (DECADRON) injection  6 mg Intravenous Q24H  . enoxaparin (LOVENOX) injection  40 mg Subcutaneous Q24H  . Ipratropium-Albuterol  1 puff Inhalation Q6H  . irbesartan  300 mg Oral QHS  . pantoprazole  40 mg Oral Daily  . zinc sulfate  220 mg Oral Daily   Continuous Infusions: . sodium chloride 10 mL/hr at 12/09/19 1429  . remdesivir 100 mg in NS 100 mL 100 mg (12/10/19 1037)     LOS: 1 day      Debbe Odea, MD [facility] Pager: [email or web address] Password [facility] 12/10/2019, 10:41 AM

## 2019-12-11 LAB — CBC WITH DIFFERENTIAL/PLATELET
Abs Immature Granulocytes: 0.08 10*3/uL — ABNORMAL HIGH (ref 0.00–0.07)
Basophils Absolute: 0 10*3/uL (ref 0.0–0.1)
Basophils Relative: 0 %
Eosinophils Absolute: 0 10*3/uL (ref 0.0–0.5)
Eosinophils Relative: 0 %
HCT: 41.6 % (ref 39.0–52.0)
Hemoglobin: 14.2 g/dL (ref 13.0–17.0)
Immature Granulocytes: 1 %
Lymphocytes Relative: 10 %
Lymphs Abs: 0.9 10*3/uL (ref 0.7–4.0)
MCH: 28.7 pg (ref 26.0–34.0)
MCHC: 34.1 g/dL (ref 30.0–36.0)
MCV: 84.2 fL (ref 80.0–100.0)
Monocytes Absolute: 0.5 10*3/uL (ref 0.1–1.0)
Monocytes Relative: 6 %
Neutro Abs: 7.3 10*3/uL (ref 1.7–7.7)
Neutrophils Relative %: 83 %
Platelets: 256 10*3/uL (ref 150–400)
RBC: 4.94 MIL/uL (ref 4.22–5.81)
RDW: 13.2 % (ref 11.5–15.5)
WBC: 8.7 10*3/uL (ref 4.0–10.5)
nRBC: 0 % (ref 0.0–0.2)

## 2019-12-11 LAB — COMPREHENSIVE METABOLIC PANEL
ALT: 88 U/L — ABNORMAL HIGH (ref 0–44)
AST: 47 U/L — ABNORMAL HIGH (ref 15–41)
Albumin: 3.1 g/dL — ABNORMAL LOW (ref 3.5–5.0)
Alkaline Phosphatase: 54 U/L (ref 38–126)
Anion gap: 8 (ref 5–15)
BUN: 24 mg/dL — ABNORMAL HIGH (ref 6–20)
CO2: 25 mmol/L (ref 22–32)
Calcium: 8.2 mg/dL — ABNORMAL LOW (ref 8.9–10.3)
Chloride: 102 mmol/L (ref 98–111)
Creatinine, Ser: 0.95 mg/dL (ref 0.61–1.24)
GFR calc Af Amer: >60 mL/min (ref >60)
GFR calc non Af Amer: >60 mL/min (ref >60)
Glucose, Bld: 146 mg/dL — ABNORMAL HIGH (ref 70–99)
Potassium: 3.1 mmol/L — ABNORMAL LOW (ref 3.5–5.1)
Sodium: 135 mmol/L (ref 135–145)
Total Bilirubin: 0.8 mg/dL (ref 0.3–1.2)
Total Protein: 6.4 g/dL — ABNORMAL LOW (ref 6.5–8.1)

## 2019-12-11 LAB — D-DIMER, QUANTITATIVE: D-Dimer, Quant: 1.48 ug/mL-FEU — ABNORMAL HIGH (ref 0.00–0.50)

## 2019-12-11 LAB — C-REACTIVE PROTEIN: CRP: 3.9 mg/dL — ABNORMAL HIGH (ref <1.0)

## 2019-12-11 LAB — FERRITIN: Ferritin: 1318 ng/mL — ABNORMAL HIGH (ref 24–336)

## 2019-12-11 MED ORDER — POTASSIUM CHLORIDE CRYS ER 20 MEQ PO TBCR
40.0000 meq | EXTENDED_RELEASE_TABLET | ORAL | Status: AC
  Administered 2019-12-11 (×2): 40 meq via ORAL
  Filled 2019-12-11 (×2): qty 2

## 2019-12-11 NOTE — Progress Notes (Signed)
PROGRESS NOTE    Troy Garza   [id number]  DOB: 10/31/1961  DOA: 12/09/2019 PCP: Denita Lung, MD   Brief Narrative:  Troy Garza is a 59 y.o. male with medical history significant of essential hypertension, hyperlipidemia, history of DVT 2017 in which he completed anticoagulation who presents with progressive shortness of breath, weakness, fatigue nausea and diarrhea.  Patient reports recent positive Covid-19 test at an urgent care last Tuesday.  Patient also endorses poor oral intake over the past several days.   Upon EMS arrival, he was noted to be hypoxic oxygenating 83% on room air.   In ED: Temperature 101.6, pulse ox 96 percent on 3 L of oxygen, sodium 131, potassium 2.4, LFTs elevated. Chest x-ray revealed bilateral interstitial prominence with small bilateral pleural effusions. Patient was started on remdesivir, Decadron, IV fluids and admitted to the hospital.  Subjective: He is breathing a little better today and is not as lightheaded.     Assessment & Plan:   Principal Problem: Sepsis, acute respiratory failure and pneumonia due to COVID-19 virus -Continue IV steroids, remdesivir, vitamin C, zinc and other supportive measures including oxygen and inhalers -Wean oxygen as able-he remains on 3 L today - 1/24> CRP is elevated at 7.1 today, I have discussed giving him Actemra and contraindications and side effects have been explained to him in detail along with the off label use for COVID 19 infection- He has given permission to receive the medication and I have ordered the infusion - 1/25- CRP now 3.9-   he is beginning to feel better   Active Problems: Dizziness, hyponatremia, hypotension, lactic acidosis - Lactic acid 2.5 - BP 106/67, BUN Cr ratio > 20 today, dizzy when ambulating in room - will give a NS bolus of 500 cc- he has no h/o CHF - BNP is 57 - holding Chlorthalidone, Avapro, Amlodipine and Coreg - better hydrated today     Elevated LFTs - mild  elevation likely in relation to COVID 19 infection- improved today- cont to follow - hold Crestor for now     Hypokalemia - K 2.4 on admission - continue to replace- 3.1 today  Obesity Body mass index is 30.41 kg/m.   Time spent in minutes: 35 DVT prophylaxis: Lovenox Code Status: Full code Family Communication:  Disposition Plan: home Consultants:   none Procedures:   none Antimicrobials:  Anti-infectives (From admission, onward)   Start     Dose/Rate Route Frequency Ordered Stop   12/10/19 1000  remdesivir 100 mg in sodium chloride 0.9 % 100 mL IVPB  Status:  Discontinued     100 mg 200 mL/hr over 30 Minutes Intravenous Daily 12/09/19 1353 12/09/19 1357   12/10/19 1000  remdesivir 100 mg in sodium chloride 0.9 % 100 mL IVPB     100 mg 200 mL/hr over 30 Minutes Intravenous Daily 12/09/19 1037 12/14/19 0959   12/09/19 1352  remdesivir 200 mg in sodium chloride 0.9% 250 mL IVPB  Status:  Discontinued     200 mg 580 mL/hr over 30 Minutes Intravenous Once 12/09/19 1353 12/09/19 1357   12/09/19 1200  remdesivir 200 mg in sodium chloride 0.9% 250 mL IVPB     200 mg 580 mL/hr over 30 Minutes Intravenous Once 12/09/19 1037 12/09/19 1335       Objective: Vitals:   12/10/19 1015 12/10/19 1155 12/10/19 2059 12/11/19 0557  BP: 112/69 104/69 115/74 125/79  Pulse: 68 73 71 66  Resp:  16 20 18   Temp:  98.1 F (36.7 C) 98.3 F (36.8 C) 97.9 F (36.6 C)  TempSrc:  Oral Oral Oral  SpO2:  92% 91% 90%  Weight:      Height:        Intake/Output Summary (Last 24 hours) at 12/11/2019 1002 Last data filed at 12/11/2019 0900 Gross per 24 hour  Intake 2135.39 ml  Output 950 ml  Net 1185.39 ml   Filed Weights   12/09/19 0907  Weight: 90.7 kg    Examination: General exam: Appears comfortable  HEENT: PERRLA, oral mucosa moist, no sclera icterus or thrush Respiratory system: crackles at bases Respiratory effort normal. Cardiovascular system: S1 & S2 heard,  No murmurs    Gastrointestinal system: Abdomen soft, non-tender, nondistended. Normal bowel sounds   Central nervous system: Alert and oriented. No focal neurological deficits. Extremities: No cyanosis, clubbing or edema Skin: No rashes or ulcers Psychiatry:  Mood & affect appropriate.     Data Reviewed: I have personally reviewed following labs and imaging studies  CBC: Recent Labs  Lab 12/09/19 0910 12/09/19 1416 12/10/19 0253 12/11/19 0256  WBC 8.7 10.4 8.7 8.7  NEUTROABS 6.8  --  7.0 7.3  HGB 15.5 15.6 15.0 14.2  HCT 43.7 45.6 44.5 41.6  MCV 81.2 84.0 85.7 84.2  PLT 188 212 218 [id number]   Basic Metabolic Panel: Recent Labs  Lab 12/09/19 0910 12/09/19 1416 12/10/19 0253 12/11/19 0256  NA 131*  --  133* 135  K 2.4*  --  3.6 3.1*  CL 95*  --  99 102  CO2 23  --  24 25  GLUCOSE 127*  --  164* 146*  BUN 17  --  23* 24*  CREATININE 0.82 1.16 0.89 0.95  CALCIUM 8.1*  --  8.0* 8.2*  MG 2.2  --  2.5*  --    GFR: Estimated Creatinine Clearance: 93.8 mL/min (by C-G formula based on SCr of 0.95 mg/dL). Liver Function Tests: Recent Labs  Lab 12/09/19 0910 12/10/19 0253 12/11/19 0256  AST 77* 63* 47*  ALT 108* 94* 88*  ALKPHOS 60 52 54  BILITOT 1.3* 1.0 0.8  PROT 7.3 6.6 6.4*  ALBUMIN 3.6 3.2* 3.1*   No results for input(s): LIPASE, AMYLASE in the last 168 hours. No results for input(s): AMMONIA in the last 168 hours. Coagulation Profile: No results for input(s): INR, PROTIME in the last 168 hours. Cardiac Enzymes: No results for input(s): CKTOTAL, CKMB, CKMBINDEX, TROPONINI in the last 168 hours. BNP (last 3 results) No results for input(s): PROBNP in the last 8760 hours. HbA1C: No results for input(s): HGBA1C in the last 72 hours. CBG: No results for input(s): GLUCAP in the last 168 hours. Lipid Profile: Recent Labs    12/09/19 0910  TRIG 79   Thyroid Function Tests: No results for input(s): TSH, T4TOTAL, FREET4, T3FREE, THYROIDAB in the last 72 hours. Anemia  Panel: Recent Labs    12/10/19 0253 12/11/19 0256  FERRITIN 1,449* 1,318*   Urine analysis: No results found for: COLORURINE, APPEARANCEUR, LABSPEC, PHURINE, GLUCOSEU, HGBUR, BILIRUBINUR, KETONESUR, PROTEINUR, UROBILINOGEN, NITRITE, LEUKOCYTESUR Sepsis Labs: @LABRCNTIP (procalcitonin:4,lacticidven:4) ) Recent Results (from the past 240 hour(s))  Blood Culture (routine x 2)     Status: None (Preliminary result)   Collection Time: 12/09/19  9:10 AM   Specimen: BLOOD RIGHT HAND  Result Value Ref Range Status   Specimen Description   Final    BLOOD RIGHT HAND Performed at [facility] [address] [place], [place] [address]  Special Requests   Final    BOTTLES DRAWN AEROBIC AND ANAEROBIC Blood Culture adequate volume Performed at [facility] [address] [place], [place] [address]    Culture   Final    NO GROWTH < 24 HOURS Performed at [facility] [address], [place], [place] [address]    Report Status PENDING  Incomplete  Blood Culture (routine x 2)     Status: None (Preliminary result)   Collection Time: 12/09/19  9:15 AM   Specimen: BLOOD  Result Value Ref Range Status   Specimen Description   Final    BLOOD RIGHT ANTECUBITAL Performed at [facility] [address] [place], [place] [address]    Special Requests   Final    BOTTLES DRAWN AEROBIC AND ANAEROBIC Blood Culture adequate volume Performed at [facility] [address] [place], [place] [address]    Culture   Final    NO GROWTH < 24 HOURS Performed at [facility] [address] [place], [place] [address]    Report Status PENDING  Incomplete      Radiology Studies: No results found.    Scheduled Meds: . vitamin C  500 mg Oral Daily  . dexamethasone (DECADRON) injection  6 mg Intravenous Q24H  . enoxaparin (LOVENOX) injection  40 mg Subcutaneous Q24H  . Ipratropium-Albuterol  1 puff Inhalation  Q6H  . pantoprazole  40 mg Oral Daily  . potassium chloride  40 mEq Oral Q4H  . zinc sulfate  220 mg Oral Daily   Continuous Infusions: . sodium chloride 10 mL/hr at 12/09/19 1429  . remdesivir 100 mg in NS 100 mL 100 mg (12/11/19 0945)     LOS: 2 days      Debbe Odea, MD [facility] Pager: [email or web address] Password [email or web address] 12/11/2019, 10:02 AM

## 2019-12-12 LAB — CBC WITH DIFFERENTIAL/PLATELET
Abs Immature Granulocytes: 0.09 10*3/uL — ABNORMAL HIGH (ref 0.00–0.07)
Basophils Absolute: 0 10*3/uL (ref 0.0–0.1)
Basophils Relative: 0 %
Eosinophils Absolute: 0 10*3/uL (ref 0.0–0.5)
Eosinophils Relative: 0 %
HCT: 40.2 % (ref 39.0–52.0)
Hemoglobin: 13.2 g/dL (ref 13.0–17.0)
Immature Granulocytes: 1 %
Lymphocytes Relative: 9 %
Lymphs Abs: 0.8 10*3/uL (ref 0.7–4.0)
MCH: 27.8 pg (ref 26.0–34.0)
MCHC: 32.8 g/dL (ref 30.0–36.0)
MCV: 84.8 fL (ref 80.0–100.0)
Monocytes Absolute: 0.7 10*3/uL (ref 0.1–1.0)
Monocytes Relative: 8 %
Neutro Abs: 7.2 10*3/uL (ref 1.7–7.7)
Neutrophils Relative %: 82 %
Platelets: 290 10*3/uL (ref 150–400)
RBC: 4.74 MIL/uL (ref 4.22–5.81)
RDW: 13.1 % (ref 11.5–15.5)
WBC: 8.8 10*3/uL (ref 4.0–10.5)
nRBC: 0 % (ref 0.0–0.2)

## 2019-12-12 LAB — COMPREHENSIVE METABOLIC PANEL
ALT: 83 U/L — ABNORMAL HIGH (ref 0–44)
AST: 40 U/L (ref 15–41)
Albumin: 2.8 g/dL — ABNORMAL LOW (ref 3.5–5.0)
Alkaline Phosphatase: 50 U/L (ref 38–126)
Anion gap: 7 (ref 5–15)
BUN: 24 mg/dL — ABNORMAL HIGH (ref 6–20)
CO2: 25 mmol/L (ref 22–32)
Calcium: 8.3 mg/dL — ABNORMAL LOW (ref 8.9–10.3)
Chloride: 104 mmol/L (ref 98–111)
Creatinine, Ser: 0.89 mg/dL (ref 0.61–1.24)
GFR calc Af Amer: >60 mL/min (ref >60)
GFR calc non Af Amer: >60 mL/min (ref >60)
Glucose, Bld: 185 mg/dL — ABNORMAL HIGH (ref 70–99)
Potassium: 3.6 mmol/L (ref 3.5–5.1)
Sodium: 136 mmol/L (ref 135–145)
Total Bilirubin: 0.6 mg/dL (ref 0.3–1.2)
Total Protein: 5.9 g/dL — ABNORMAL LOW (ref 6.5–8.1)

## 2019-12-12 LAB — D-DIMER, QUANTITATIVE: D-Dimer, Quant: 0.83 ug/mL-FEU — ABNORMAL HIGH (ref 0.00–0.50)

## 2019-12-12 LAB — FERRITIN: Ferritin: 1309 ng/mL — ABNORMAL HIGH (ref 24–336)

## 2019-12-12 LAB — C-REACTIVE PROTEIN: CRP: 1.6 mg/dL — ABNORMAL HIGH (ref <1.0)

## 2019-12-12 MED ORDER — AMLODIPINE BESYLATE 5 MG PO TABS
5.0000 mg | ORAL_TABLET | Freq: Every day | ORAL | Status: DC
  Administered 2019-12-12 – 2019-12-13 (×2): 5 mg via ORAL
  Filled 2019-12-12 (×2): qty 1

## 2019-12-12 MED ORDER — DEXAMETHASONE SODIUM PHOSPHATE 10 MG/ML IJ SOLN
6.0000 mg | Freq: Every day | INTRAMUSCULAR | Status: DC
  Administered 2019-12-12 – 2019-12-13 (×2): 6 mg via INTRAVENOUS
  Filled 2019-12-12 (×2): qty 1

## 2019-12-12 NOTE — Progress Notes (Signed)
I concur with previous RN assessment documentation.  

## 2019-12-12 NOTE — Progress Notes (Signed)
   Patient Saturations on Room Air at Rest = 91%  Patient Saturations on [employer] while Ambulating = 87%  Patient Saturations on 2 Liters of oxygen while Ambulating = 92%  Please briefly explain why patient needs home oxygen:

## 2019-12-12 NOTE — Progress Notes (Signed)
PROGRESS NOTE    Troy Garza   [id number]  DOB: 1961/05/21  DOA: 12/09/2019 PCP: Denita Lung, MD   Brief Narrative:  Troy Garza is a 59 y.o. male with medical history significant of essential hypertension, hyperlipidemia, history of DVT 2017 in which he completed anticoagulation who presents with progressive shortness of breath, weakness, fatigue nausea and diarrhea.  Patient reports recent positive Covid-19 test at an urgent care last Tuesday.  Patient also endorses poor oral intake over the past several days.   Upon EMS arrival, he was noted to be hypoxic oxygenating 83% on room air.   In ED: Temperature 101.6, pulse ox 96 percent on 3 L of oxygen, sodium 131, potassium 2.4, LFTs elevated. Chest x-ray revealed bilateral interstitial prominence with small bilateral pleural effusions. Patient was started on remdesivir, Decadron, IV fluids and admitted to the hospital.  Subjective: He is still a little dizzy with ambulation.  Assessment & Plan:   Principal Problem: Sepsis, acute respiratory failure and pneumonia due to COVID-19 virus -Continue IV steroids, remdesivir, vitamin C, zinc and other supportive measures including oxygen and inhalers -Wean oxygen as able-  - 1/24> CRP is elevated at 7, I have discussed giving him Actemra and contraindications and side effects have been explained to him in detail along with the off label use for COVID 19 infection- He has given permission to receive the medication and I have ordered the infusion  - CRP now 1.6- he is still requiring 2 O2 on ambulation   Active Problems: Dizziness, hyponatremia, hypotension, lactic acidosis - Lactic acid 2.5 - BP 106/67, BUN Cr ratio > 20 today, dizzy when ambulating in room - will give a NS bolus of 500 cc- he has no h/o CHF - BNP is 57 - holding Chlorthalidone, Avapro, Amlodipine and Coreg - cont oral hydration     Elevated LFTs - mild elevation likely in relation to COVID 19 infection- improved  today- cont to follow - hold Crestor for now     Hypokalemia - K 2.4 on admission - now 3.6  Obesity Body mass index is 30.41 kg/m.   Time spent in minutes: 35 DVT prophylaxis: Lovenox Code Status: Full code Family Communication:  Disposition Plan: home Consultants:   none Procedures:   none Antimicrobials:  Anti-infectives (From admission, onward)   Start     Dose/Rate Route Frequency Ordered Stop   12/10/19 1000  remdesivir 100 mg in sodium chloride 0.9 % 100 mL IVPB  Status:  Discontinued     100 mg 200 mL/hr over 30 Minutes Intravenous Daily 12/09/19 1353 12/09/19 1357   12/10/19 1000  remdesivir 100 mg in sodium chloride 0.9 % 100 mL IVPB     100 mg 200 mL/hr over 30 Minutes Intravenous Daily 12/09/19 1037 12/14/19 0959   12/09/19 1352  remdesivir 200 mg in sodium chloride 0.9% 250 mL IVPB  Status:  Discontinued     200 mg 580 mL/hr over 30 Minutes Intravenous Once 12/09/19 1353 12/09/19 1357   12/09/19 1200  remdesivir 200 mg in sodium chloride 0.9% 250 mL IVPB     200 mg 580 mL/hr over 30 Minutes Intravenous Once 12/09/19 1037 12/09/19 1335       Objective: Vitals:   12/11/19 0557 12/11/19 1425 12/11/19 2024 12/12/19 0515  BP: 125/79 134/79 129/82 (!) 141/91  Pulse: 66 70 72 72  Resp: 18 20 18 18   Temp: 97.9 F (36.6 C) 97.8 F (36.6 C) 97.9 F (36.6 C) 97.9 F (36.6  C)  TempSrc: Oral Oral Oral Oral  SpO2: 90% 91% 94% 94%  Weight:      Height:        Intake/Output Summary (Last 24 hours) at 12/12/2019 0850 Last data filed at 12/11/2019 1400 Gross per 24 hour  Intake 815 ml  Output 275 ml  Net 540 ml   Filed Weights   12/09/19 0907  Weight: 90.7 kg    Examination: General exam: Appears comfortable  HEENT: PERRLA, oral mucosa moist, no sclera icterus or thrush Respiratory system: crackles at bases Respiratory effort normal. Cardiovascular system: S1 & S2 heard,  No murmurs  Gastrointestinal system: Abdomen soft, non-tender, nondistended.  Normal bowel sounds   Central nervous system: Alert and oriented. No focal neurological deficits. Extremities: No cyanosis, clubbing or edema Skin: No rashes or ulcers Psychiatry:  Mood & affect appropriate.     Data Reviewed: I have personally reviewed following labs and imaging studies  CBC: Recent Labs  Lab 12/09/19 0910 12/09/19 1416 12/10/19 0253 12/11/19 0256 12/12/19 0326  WBC 8.7 10.4 8.7 8.7 8.8  NEUTROABS 6.8  --  7.0 7.3 7.2  HGB 15.5 15.6 15.0 14.2 13.2  HCT 43.7 45.6 44.5 41.6 40.2  MCV 81.2 84.0 85.7 84.2 84.8  PLT 188 212 218 256 [id number]   Basic Metabolic Panel: Recent Labs  Lab 12/09/19 0910 12/09/19 1416 12/10/19 0253 12/11/19 0256 12/12/19 0326  NA 131*  --  133* 135 136  K 2.4*  --  3.6 3.1* 3.6  CL 95*  --  99 102 104  CO2 23  --  24 25 25   GLUCOSE 127*  --  164* 146* 185*  BUN 17  --  23* 24* 24*  CREATININE 0.82 1.16 0.89 0.95 0.89  CALCIUM 8.1*  --  8.0* 8.2* 8.3*  MG 2.2  --  2.5*  --   --    GFR: Estimated Creatinine Clearance: 100.1 mL/min (by C-G formula based on SCr of 0.89 mg/dL). Liver Function Tests: Recent Labs  Lab 12/09/19 0910 12/10/19 0253 12/11/19 0256 12/12/19 0326  AST 77* 63* 47* 40  ALT 108* 94* 88* 83*  ALKPHOS 60 52 54 50  BILITOT 1.3* 1.0 0.8 0.6  PROT 7.3 6.6 6.4* 5.9*  ALBUMIN 3.6 3.2* 3.1* 2.8*   No results for input(s): LIPASE, AMYLASE in the last 168 hours. No results for input(s): AMMONIA in the last 168 hours. Coagulation Profile: No results for input(s): INR, PROTIME in the last 168 hours. Cardiac Enzymes: No results for input(s): CKTOTAL, CKMB, CKMBINDEX, TROPONINI in the last 168 hours. BNP (last 3 results) No results for input(s): PROBNP in the last 8760 hours. HbA1C: No results for input(s): HGBA1C in the last 72 hours. CBG: No results for input(s): GLUCAP in the last 168 hours. Lipid Profile: Recent Labs    12/09/19 0910  TRIG 79   Thyroid Function Tests: No results for input(s): TSH,  T4TOTAL, FREET4, T3FREE, THYROIDAB in the last 72 hours. Anemia Panel: Recent Labs    12/11/19 0256 12/12/19 0326  FERRITIN 1,318* 1,309*   Urine analysis: No results found for: COLORURINE, APPEARANCEUR, LABSPEC, PHURINE, GLUCOSEU, HGBUR, BILIRUBINUR, KETONESUR, PROTEINUR, UROBILINOGEN, NITRITE, LEUKOCYTESUR Sepsis Labs: @LABRCNTIP (procalcitonin:4,lacticidven:4) ) Recent Results (from the past 240 hour(s))  Blood Culture (routine x 2)     Status: None (Preliminary result)   Collection Time: 12/09/19  9:10 AM   Specimen: BLOOD RIGHT HAND  Result Value Ref Range Status   Specimen Description   Final  BLOOD RIGHT HAND Performed at [facility], [place] [address], [place], [place] [address]    Special Requests   Final    BOTTLES DRAWN AEROBIC AND ANAEROBIC Blood Culture adequate volume Performed at [facility] [address], [place], [place] [address]    Culture   Final    NO GROWTH 3 DAYS Performed at [facility], [place] [address] [place], [place] [address]    Report Status PENDING  Incomplete  Blood Culture (routine x 2)     Status: None (Preliminary result)   Collection Time: 12/09/19  9:15 AM   Specimen: BLOOD  Result Value Ref Range Status   Specimen Description   Final    BLOOD RIGHT ANTECUBITAL Performed at [facility] [address], [place], [place] [address]    Special Requests   Final    BOTTLES DRAWN AEROBIC AND ANAEROBIC Blood Culture adequate volume Performed at [facility] [address] [place], [place] [address]    Culture   Final    NO GROWTH 3 DAYS Performed at [facility], [place] [address], [place], [place] [address]    Report Status PENDING  Incomplete      Radiology Studies: No results found.    Scheduled Meds: . vitamin C  500 mg Oral Daily  . dexamethasone (DECADRON) injection  6 mg Intravenous Q24H  . enoxaparin (LOVENOX) injection  40 mg  Subcutaneous Q24H  . Ipratropium-Albuterol  1 puff Inhalation Q6H  . pantoprazole  40 mg Oral Daily  . zinc sulfate  220 mg Oral Daily   Continuous Infusions: . sodium chloride 10 mL/hr at 12/09/19 1429  . remdesivir 100 mg in NS 100 mL 100 mg (12/11/19 0945)     LOS: 3 days      Debbe Odea, MD [facility] Pager: [email or web address] Password [facility] 12/12/2019, 8:50 AM

## 2019-12-13 DIAGNOSIS — I10 Essential (primary) hypertension: Secondary | ICD-10-CM

## 2019-12-13 LAB — CBC WITH DIFFERENTIAL/PLATELET
Abs Immature Granulocytes: 0.1 10*3/uL — ABNORMAL HIGH (ref 0.00–0.07)
Basophils Absolute: 0 10*3/uL (ref 0.0–0.1)
Basophils Relative: 0 %
Eosinophils Absolute: 0 10*3/uL (ref 0.0–0.5)
Eosinophils Relative: 0 %
HCT: 38.6 % — ABNORMAL LOW (ref 39.0–52.0)
Hemoglobin: 12.9 g/dL — ABNORMAL LOW (ref 13.0–17.0)
Immature Granulocytes: 1 %
Lymphocytes Relative: 8 %
Lymphs Abs: 0.8 10*3/uL (ref 0.7–4.0)
MCH: 28.1 pg (ref 26.0–34.0)
MCHC: 33.4 g/dL (ref 30.0–36.0)
MCV: 84.1 fL (ref 80.0–100.0)
Monocytes Absolute: 0.6 10*3/uL (ref 0.1–1.0)
Monocytes Relative: 6 %
Neutro Abs: 8.3 10*3/uL — ABNORMAL HIGH (ref 1.7–7.7)
Neutrophils Relative %: 85 %
Platelets: 336 10*3/uL (ref 150–400)
RBC: 4.59 MIL/uL (ref 4.22–5.81)
RDW: 13 % (ref 11.5–15.5)
WBC: 9.9 10*3/uL (ref 4.0–10.5)
nRBC: 0 % (ref 0.0–0.2)

## 2019-12-13 LAB — COMPREHENSIVE METABOLIC PANEL
ALT: 90 U/L — ABNORMAL HIGH (ref 0–44)
AST: 42 U/L — ABNORMAL HIGH (ref 15–41)
Albumin: 2.9 g/dL — ABNORMAL LOW (ref 3.5–5.0)
Alkaline Phosphatase: 46 U/L (ref 38–126)
Anion gap: 7 (ref 5–15)
BUN: 22 mg/dL — ABNORMAL HIGH (ref 6–20)
CO2: 23 mmol/L (ref 22–32)
Calcium: 8.2 mg/dL — ABNORMAL LOW (ref 8.9–10.3)
Chloride: 105 mmol/L (ref 98–111)
Creatinine, Ser: 0.83 mg/dL (ref 0.61–1.24)
GFR calc Af Amer: >60 mL/min (ref >60)
GFR calc non Af Amer: >60 mL/min (ref >60)
Glucose, Bld: 257 mg/dL — ABNORMAL HIGH (ref 70–99)
Potassium: 3.6 mmol/L (ref 3.5–5.1)
Sodium: 135 mmol/L (ref 135–145)
Total Bilirubin: 0.8 mg/dL (ref 0.3–1.2)
Total Protein: 5.5 g/dL — ABNORMAL LOW (ref 6.5–8.1)

## 2019-12-13 LAB — FERRITIN: Ferritin: 993 ng/mL — ABNORMAL HIGH (ref 24–336)

## 2019-12-13 LAB — C-REACTIVE PROTEIN: CRP: 0.8 mg/dL (ref <1.0)

## 2019-12-13 LAB — D-DIMER, QUANTITATIVE: D-Dimer, Quant: 0.77 ug/mL-FEU — ABNORMAL HIGH (ref 0.00–0.50)

## 2019-12-13 MED ORDER — GUAIFENESIN-DM 100-10 MG/5ML PO SYRP: 10.0000 mL | ORAL_SOLUTION | ORAL | 0 refills | Status: DC | PRN

## 2019-12-13 MED ORDER — ZINC SULFATE 220 (50 ZN) MG PO CAPS: 220.0000 mg | ORAL_CAPSULE | Freq: Every day | ORAL | 0 refills | Status: DC

## 2019-12-13 NOTE — Plan of Care (Signed)
  Problem: Education: Goal: Knowledge of General Education information will improve Description: Including pain rating scale, medication(s)/side effects and non-pharmacologic comfort measures Outcome: Progressing   NAE. Potential d/c today.

## 2019-12-13 NOTE — Discharge Instructions (Signed)
YOUR LIVER FUNCTION TESTS ARE ELEVATED- HOLD OFF ON CRESTOR FOR NOW UNTIL THESE CAN BE RECHECKED IN YOUR DOCTOR'S OFFICE NEXT WEEK STAY ADEQUATELY HYDRATED  You were cared for by a hospitalist during your hospital stay. If you have any questions about your discharge medications or the care you received while you were in the hospital after you are discharged, you can call the unit and asked to speak with the hospitalist on call if the hospitalist that took care of you is not available. Once you are discharged, your primary care physician will handle any further medical issues.   Please note that NO REFILLS for any discharge medications will be authorized once you are discharged, as it is imperative that you return to your primary care physician (or establish a relationship with a primary care physician if you do not have one) for your aftercare needs so that they can reassess your need for medications and monitor your lab values.  Please take all your medications with you for your next visit with your Primary MD. Please ask your Primary MD to get all Hospital records sent to his/her office. Please request your Primary MD to go over all hospital test results at the follow up.   If you experience worsening of your admission symptoms, develop shortness of breath, chest pain, suicidal or homicidal thoughts or a life threatening emergency, you must seek medical attention immediately by calling 911 or calling your MD.   Dennis Bast must read the complete instructions/literature along with all the possible adverse reactions/side effects for all the medicines you take including new medications that have been prescribed to you. Take new medicines after you have completely understood and accpet all the possible adverse reactions/side effects.    Do not drive when taking pain medications or sedatives.     Do not take more than prescribed Pain, Sleep and Anxiety Medications   If you have smoked or chewed Tobacco in  the last 2 yrs please stop. Stop any regular alcohol  and or recreational drug use.   Wear Seat belts while driving.

## 2019-12-13 NOTE — Discharge Summary (Signed)
Physician Discharge Summary  Troy Garza [id number] DOB: [birth year] DOA: 12/09/2019  PCP: Denita Lung, MD  Admit date: 12/09/2019 Discharge date: 12/13/2019  Admitted From: home Disposition:  home   Recommendations for Outpatient Follow-up:  1. F/u on Cmet in 1 wk to check electrolytes and LFTs  Home Health:  none  Equipment/Devices:  none    Discharge Condition:  stable   CODE STATUS:  Full code   Diet recommendation:  Heart healthy Consultations:  none    Discharge Diagnoses:  Principal Problem:   Pneumonia due to COVID-19 virus Active Problems:   Hypertension   Elevated LFTs   Elevated d-dimer   Hyperlipidemia   Hyponatremia   Hypokalemia     Brief Summary: Troy Garza is a 59 y.o.malewith medical history significant ofessential hypertension, hyperlipidemia, history of DVT 2017 in which he completed anticoagulation who presents with progressive shortness of breath, weakness, fatigue nausea and diarrhea. Patient reports recent positive Covid-19 test at an urgent care last Tuesday. Patient also endorses poor oral intake over the past several days.  Upon EMS arrival, he was noted to be hypoxic oxygenating 83% on room air.  In ED: Temperature 101.6, pulse ox 96 percent on 3 L of oxygen, sodium 131, potassium 2.4, LFTs elevated. Chest x-ray revealed bilateral interstitial prominence with small bilateral pleural effusions. Patient was started on remdesivir, Decadron, IV fluids and admitted to the hospital.  Hospital Course:  Principal Problem: Sepsis, acute respiratory failure and pneumonia due to COVID-19 virus -treated with IV steroids, remdesivir, vitamin C, zinc and other supportive measures including oxygen and inhalers - 1/24> CRP  elevated at 7- given Actemra  - steadily weaned down from 3 L O2- he was 88 % on room air yesterday and wanted to d/c home but I advised him to stay 1 more day until has last dose of medications could be given in house and  until his hypoxia could improved - - inflammatory markers and hypoxia improved- ok to d/c home   Active Problems: Dizziness, hyponatremia, hypotension, lactic acidosis - Lactic acid 2.5 - BP 106/67, BUN Cr ratio > 20 today, dizzy when ambulating in room - will give a NS bolus of 500 cc- he has no h/o CHF - BNP is 57 - holding Chlorthalidone, Avapro, Amlodipine and Coreg - as BP rose, medications have been resumed- advised to cont to maintain adequate hydration- needs Bmet in 1 wk     Elevated LFTs - mild elevation likely in relation to COVID 19 infection vs underlying fatty liver - hold Crestor for now     Hypokalemia - K 2.4 on admission - now 3.6  Obesity Body mass index is 30.41 kg/m.   Discharge Exam: Vitals:   12/12/19 2033 12/13/19 0528  BP: (!) 143/78 (!) 156/86  Pulse: 78 64  Resp: 20 20  Temp: 98 F (36.7 C) 98 F (36.7 C)  SpO2: 93% 93%   Vitals:   12/12/19 0515 12/12/19 1139 12/12/19 2033 12/13/19 0528  BP: (!) 141/91 (!) 143/93 (!) 143/78 (!) 156/86  Pulse: 72 68 78 64  Resp: 18 20 20 20   Temp: 97.9 F (36.6 C) 98.1 F (36.7 C) 98 F (36.7 C) 98 F (36.7 C)  TempSrc: Oral Oral Oral Oral  SpO2: 94% 91% 93% 93%  Weight:      Height:        General: Pt is alert, awake, not in acute distress Cardiovascular: RRR, S1/S2 +, no rubs, no gallops Respiratory: CTA bilaterally, no  wheezing, no rhonchi Abdominal: Soft, NT, ND, bowel sounds + Extremities: no edema, no cyanosis   Discharge Instructions  Discharge Instructions    Diet - low sodium heart healthy   Complete by: As directed    Increase activity slowly   Complete by: As directed      Allergies as of 12/13/2019      Reactions   Penicillins Hives   Did it involve swelling of the face/tongue/throat, SOB, or low BP? No Did it involve sudden or severe rash/hives, skin peeling, or any reaction on the inside of your mouth or nose? No Did you need to seek medical attention at a hospital or  doctor's office? No When did it last happen?Childhood If all above answers are "NO", may proceed with cephalosporin use.      Medication List    STOP taking these medications   rosuvastatin 20 MG tablet Commonly known as: CRESTOR   Testosterone 20.25 MG/ACT (1.62%) Gel Commonly known as: AndroGel Pump     TAKE these medications   amLODipine 5 MG tablet Commonly known as: NORVASC Take 1 tablet (5 mg total) by mouth daily.   carvedilol 12.5 MG tablet Commonly known as: COREG TAKE 1 TABLET (12.5 MG TOTAL) BY MOUTH 2 (TWO) TIMES DAILY. What changed: Another medication with the same name was removed. Continue taking this medication, and follow the directions you see here.   Edarbyclor 40-12.5 MG Tabs Generic drug: Azilsartan-Chlorthalidone TAKE ONE TABLET BY MOUTH DAILY What changed: when to take this   guaiFENesin-dextromethorphan 100-10 MG/5ML syrup Commonly known as: ROBITUSSIN DM Take 10 mLs by mouth every 4 (four) hours as needed for cough.   multivitamin with minerals tablet Take 1 tablet by mouth daily.   nitroGLYCERIN 0.4 MG SL tablet Commonly known as: NITROSTAT Place 1 tablet (0.4 mg total) under the tongue every 5 (five) minutes as needed for chest pain.   omeprazole 20 MG capsule Commonly known as: PRILOSEC Take 20 mg by mouth daily.   vitamin C 500 MG tablet Commonly known as: ASCORBIC ACID Take 500 mg by mouth daily.   zinc gluconate 50 MG tablet Take 50 mg by mouth daily.   zinc sulfate 220 (50 Zn) MG capsule Take 1 capsule (220 mg total) by mouth daily.      Follow-up Information    Denita Lung, MD Follow up.   Specialty: Family Medicine Contact information: [place] [address] [phone]        Nelva Bush, MD Follow up.   Specialty: Cardiology Contact information: [address] [place] [address] [phone]          Allergies  Allergen Reactions  . Penicillins Hives     Did it involve swelling of the face/tongue/throat, SOB, or low BP? No Did it involve sudden or severe rash/hives, skin peeling, or any reaction on the inside of your mouth or nose? No Did you need to seek medical attention at a hospital or doctor's office? No When did it last happen?Childhood If all above answers are "NO", may proceed with cephalosporin use.     Procedures/Studies:    DG Chest Port 1 View  Result Date: 12/09/2019 CLINICAL DATA:  Shortness of breath. EXAM: PORTABLE CHEST 1 VIEW COMPARISON:  Chest x-ray 12/29/2017. FINDINGS: Mediastinum hilar structures normal. Cardiomegaly. Diffuse bilateral interstitial prominence. Interstitial edema and/or pneumonitis could present in this fashion. Small bilateral pleural effusions. No pneumothorax. IMPRESSION: Cardiomegaly with bilateral interstitial prominence and small bilateral pleural  effusions. Findings suggest CHF. Pneumonitis cannot be excluded. Electronically Signed   By: Marcello Moores  Register   On: 12/09/2019 09:35      The results of significant diagnostics from this hospitalization (including imaging, microbiology, ancillary and laboratory) are listed below for reference.     Microbiology: Recent Results (from the past 240 hour(s))  Blood Culture (routine x 2)     Status: None (Preliminary result)   Collection Time: 12/09/19  9:10 AM   Specimen: BLOOD RIGHT HAND  Result Value Ref Range Status   Specimen Description   Final    BLOOD RIGHT HAND Performed at [facility], [place] [address], [place], [place] [address]    Special Requests   Final    BOTTLES DRAWN AEROBIC AND ANAEROBIC Blood Culture adequate volume Performed at [place] [address] [place], [place] [address]    Culture   Final    NO GROWTH 3 DAYS Performed at [facility], [place] [address], [place], [place] [address]    Report Status PENDING  Incomplete  Blood Culture (routine x 2)     Status: None  (Preliminary result)   Collection Time: 12/09/19  9:15 AM   Specimen: BLOOD  Result Value Ref Range Status   Specimen Description   Final    BLOOD RIGHT ANTECUBITAL Performed at [place] [address], [place], [place] [address]    Special Requests   Final    BOTTLES DRAWN AEROBIC AND ANAEROBIC Blood Culture adequate volume Performed at [place] [address] [place], [place] [address]    Culture   Final    NO GROWTH 3 DAYS Performed at [facility], [place] [address] [place], [place] [address]    Report Status PENDING  Incomplete     Labs: BNP (last 3 results) Recent Labs    12/09/19 1416  BNP 0000000   Basic Metabolic Panel: Recent Labs  Lab 12/09/19 0910 12/09/19 0910 12/09/19 1416 12/10/19 0253 12/11/19 0256 12/12/19 0326 12/13/19 0344  NA 131*  --   --  133* 135 136 135  K 2.4*  --   --  3.6 3.1* 3.6 3.6  CL 95*  --   --  99 102 104 105  CO2 23  --   --  24 25 25 23   GLUCOSE 127*  --   --  164* 146* 185* 257*  BUN 17  --   --  23* 24* 24* 22*  CREATININE 0.82   < > 1.16 0.89 0.95 0.89 0.83  CALCIUM 8.1*  --   --  8.0* 8.2* 8.3* 8.2*  MG 2.2  --   --  2.5*  --   --   --    < > = values in this interval not displayed.   Liver Function Tests: Recent Labs  Lab 12/09/19 0910 12/10/19 0253 12/11/19 0256 12/12/19 0326 12/13/19 0344  AST 77* 63* 47* 40 42*  ALT 108* 94* 88* 83* 90*  ALKPHOS 60 52 54 50 46  BILITOT 1.3* 1.0 0.8 0.6 0.8  PROT 7.3 6.6 6.4* 5.9* 5.5*  ALBUMIN 3.6 3.2* 3.1* 2.8* 2.9*   No results for input(s): LIPASE, AMYLASE in the last 168 hours. No results for input(s): AMMONIA in the last 168 hours. CBC: Recent Labs  Lab 12/09/19 0910 12/09/19 0910 12/09/19 1416 12/10/19 0253 12/11/19 0256 12/12/19 0326 12/13/19 0344  WBC 8.7   < > 10.4 8.7 8.7  8.8 9.9  NEUTROABS 6.8  --   --  7.0 7.3 7.2 8.3*  HGB 15.5   < > 15.6 15.0 14.2 13.2 12.9*  HCT 43.7   < > 45.6 44.5 41.6 40.2 38.6*   MCV 81.2   < > 84.0 85.7 84.2 84.8 84.1  PLT 188   < > 212 218 256 290 336   < > = values in this interval not displayed.   Cardiac Enzymes: No results for input(s): CKTOTAL, CKMB, CKMBINDEX, TROPONINI in the last 168 hours. BNP: Invalid input(s): POCBNP CBG: No results for input(s): GLUCAP in the last 168 hours. D-Dimer Recent Labs    12/12/19 0326 12/13/19 0344  DDIMER 0.83* 0.77*   Hgb A1c No results for input(s): HGBA1C in the last 72 hours. Lipid Profile No results for input(s): CHOL, HDL, LDLCALC, TRIG, CHOLHDL, LDLDIRECT in the last 72 hours. Thyroid function studies No results for input(s): TSH, T4TOTAL, T3FREE, THYROIDAB in the last 72 hours.  Invalid input(s): FREET3 Anemia work up Recent Labs    12/12/19 0326 12/13/19 0344  FERRITIN 1,309* 993*   Urinalysis No results found for: COLORURINE, APPEARANCEUR, LABSPEC, [place], GLUCOSEU, [place], BILIRUBINUR, KETONESUR, [place], UROBILINOGEN, NITRITE, LEUKOCYTESUR Sepsis Labs Invalid input(s): PROCALCITONIN,  WBC,  LACTICIDVEN Microbiology Recent Results (from the past 240 hour(s))  Blood Culture (routine x 2)     Status: None (Preliminary result)   Collection Time: 12/09/19  9:10 AM   Specimen: BLOOD RIGHT HAND  Result Value Ref Range Status   Specimen Description   Final    BLOOD RIGHT HAND Performed at [facility], [place] [address], [place], [place] [address]    Special Requests   Final    BOTTLES DRAWN AEROBIC AND ANAEROBIC Blood Culture adequate volume Performed at [place] [address] [place], [place] [address]    Culture   Final    NO GROWTH 3 DAYS Performed at [facility], [place] [address] [place], [place] [address]    Report Status PENDING  Incomplete  Blood Culture (routine x 2)     Status: None (Preliminary result)   Collection Time: 12/09/19  9:15 AM   Specimen: BLOOD  Result Value Ref Range Status   Specimen Description   Final    BLOOD  RIGHT ANTECUBITAL Performed at [facility] [address] [place], [place] [address]    Special Requests   Final    BOTTLES DRAWN AEROBIC AND ANAEROBIC Blood Culture adequate volume Performed at [facility] [place] [address] [place], [place] [address]    Culture   Final    NO GROWTH 3 DAYS Performed at [facility], [place] [address] [place], [place] [address]    Report Status PENDING  Incomplete     Time coordinating discharge in minutes: 71  SIGNED:   Debbe Odea, MD  [facility] 12/13/2019, 8:17 AM Pager   If 7PM-7AM, please contact night-coverage [email or web address] Password [email or web address]

## 2019-12-14 ENCOUNTER — Telehealth: Payer: Self-pay

## 2019-12-14 LAB — CULTURE, BLOOD (ROUTINE X 2)
Culture: NO GROWTH
Culture: NO GROWTH
Special Requests: ADEQUATE
Special Requests: ADEQUATE

## 2019-12-14 NOTE — Telephone Encounter (Signed)
Pt was called to make a appt for next week for hospital follow up. No answer and could not lvm. Will sen out a my chart message. Declo

## 2019-12-16 ENCOUNTER — Telehealth: Payer: Self-pay

## 2019-12-16 NOTE — Telephone Encounter (Signed)
Patient was informed of recommendations. He was informed that he could be seen at the respiratory clinic tonight but he stated he is feeling better.

## 2019-12-16 NOTE — Telephone Encounter (Signed)
Patient had covid. He recent;y went to the hospital. He now still has fatigue and feels run down and also has only a small appetite. Please advise patient on next step.

## 2019-12-16 NOTE — Telephone Encounter (Signed)
[facility]-see if we can possibly schedule him for respiratory clinic tonight?   I reviewed the hospital note.  He seems to have been quite sick when he entered the hospital.  He certainly needs to use the treatment recommendations that they gave him including rest, hydration, medications they advised.  If his blood pressure is low such as less than 100/60, if his pulse rate is really fast such as greater than 120, if his oxygen level is less than 90%, assuming he has a blood pressure cuff and pulse ox to check these things, then he should go back to the hospital.  If he has none of those findings or is at least improved from where he was discharged in the hospital, consider getting checked out at urgent care this weekend as we have no openings currently and he was sick enough that he should probably be seen again

## 2019-12-22 ENCOUNTER — Encounter: Payer: Self-pay | Admitting: Family Medicine

## 2019-12-22 ENCOUNTER — Other Ambulatory Visit: Payer: Self-pay

## 2019-12-22 ENCOUNTER — Ambulatory Visit (INDEPENDENT_AMBULATORY_CARE_PROVIDER_SITE_OTHER): Payer: BC Managed Care – PPO | Admitting: Family Medicine

## 2019-12-22 VITALS — Temp 98.6°F | Wt 190.0 lb

## 2019-12-22 DIAGNOSIS — U071 COVID-19: Secondary | ICD-10-CM | POA: Diagnosis not present

## 2019-12-22 DIAGNOSIS — J1282 Pneumonia due to coronavirus disease 2019: Secondary | ICD-10-CM

## 2019-12-22 MED ORDER — ALBUTEROL SULFATE HFA 108 (90 BASE) MCG/ACT IN AERS: 2.0000 | INHALATION_SPRAY | Freq: Four times a day (QID) | RESPIRATORY_TRACT | 0 refills | Status: DC | PRN

## 2019-12-22 NOTE — Addendum Note (Signed)
Addended by: Denita Lung on: 12/22/2019 04:33 PM   Modules accepted: Orders

## 2019-12-22 NOTE — Progress Notes (Addendum)
   Subjective:    Patient ID: Troy Garza, male    DOB: [birth year], 59 y.o.   MRN: [id number]  HPI Documentation for virtual telephone encounter. Documentation for virtual audio and video telecommunications through [place] encounter: The patient was located at home. The provider was located in the office. The patient did consent to this visit and is aware of possible charges through their insurance for this visit. The other persons participating in this telemedicine service were none. This virtual service is not related to other E/M service within previous 7 days. He was diagnosed on January 12 with Covid with symptoms that started that day.  He isolated and did fairly well until around the 21st when he started having more difficulty.  The next day he called the office again and I sent him to the hospital.  He was admitted and diagnosed with pneumonia secondary to Covid.  He did have some blood work with minor abnormalities that we will need to follow-up on.  He complains of continued difficulty with weakness, dyspnea on exertion however he says his appetite is better.  No sore throat, fever, chills or coughing.  He states that he is roughly 50% better.    Review of Systems     Objective:   Physical Exam Alert and in no distress.  No evidence of tachypnea noted. The hospital medical record including labs, discharge summary and ER record was evaluated.      Assessment & Plan:  Pneumonia due to COVID-19 virus - Plan: albuterol (VENTOLIN HFA) 108 (90 Base) MCG/ACT inhaler He is to keep himself hydrated, eat whenever he feels comfortable eating.  I will have him come in next Thursday.  We will follow-up on the blood work that was abnormal from the hospital. Also recommend he try the Proventil to see if it will help with his breathing.

## 2019-12-27 DIAGNOSIS — Z8616 Personal history of COVID-19: Secondary | ICD-10-CM | POA: Diagnosis not present

## 2019-12-29 ENCOUNTER — Ambulatory Visit (INDEPENDENT_AMBULATORY_CARE_PROVIDER_SITE_OTHER): Payer: BC Managed Care – PPO | Admitting: Family Medicine

## 2019-12-29 ENCOUNTER — Encounter: Payer: Self-pay | Admitting: Family Medicine

## 2019-12-29 ENCOUNTER — Ambulatory Visit: Payer: 59 | Admitting: Cardiology

## 2019-12-29 VITALS — BP 136/88 | HR 102 | Temp 96.9°F | Wt 197.0 lb

## 2019-12-29 DIAGNOSIS — U071 COVID-19: Secondary | ICD-10-CM

## 2019-12-29 DIAGNOSIS — J1282 Pneumonia due to coronavirus disease 2019: Secondary | ICD-10-CM | POA: Diagnosis not present

## 2019-12-29 NOTE — Progress Notes (Signed)
   Subjective:    Patient ID: Troy Garza, male    DOB: 04/08/1961, 59 y.o.   MRN: [id number]  HPI He is here for a recheck.  He states that he is now 95% better.  He is occasionally using his inhaler when he has had some difficulty with breathing and plans to keep it available at when he goes back to work.  He has no chest pain, shortness of breath, headache, nausea.   Review of Systems     Objective:   Physical Exam Alert and in no distress.  Cardiac exam shows regular rhythm without murmurs or gallops.  Lungs are clear to auscultation.       Assessment & Plan:  COVID-19  Pneumonia due to COVID-19 virus He is cleared to go back to work.

## 2020-01-12 ENCOUNTER — Ambulatory Visit: Payer: 59 | Admitting: Cardiology

## 2020-01-13 ENCOUNTER — Ambulatory Visit: Payer: BC Managed Care – PPO | Admitting: Cardiology

## 2020-01-16 ENCOUNTER — Encounter: Payer: Self-pay | Admitting: Cardiology

## 2020-01-16 ENCOUNTER — Other Ambulatory Visit: Payer: Self-pay

## 2020-01-16 ENCOUNTER — Ambulatory Visit: Payer: BC Managed Care – PPO | Admitting: Cardiology

## 2020-01-16 VITALS — BP 117/88 | HR 94 | Temp 97.3°F | Ht 68.0 in | Wt 200.0 lb

## 2020-01-16 DIAGNOSIS — I10 Essential (primary) hypertension: Secondary | ICD-10-CM | POA: Diagnosis not present

## 2020-01-16 DIAGNOSIS — E782 Mixed hyperlipidemia: Secondary | ICD-10-CM

## 2020-01-16 MED ORDER — ROSUVASTATIN CALCIUM 20 MG PO TABS: 20.0000 mg | ORAL_TABLET | Freq: Every day | ORAL | 3 refills | Status: DC

## 2020-01-16 NOTE — Progress Notes (Signed)
Patient referred by Denita Lung, MD for chest pain  Subjective:   Troy Garza, male    DOB: [birth year], 59 y.o.   MRN: [id number]    Chief Complaint  Patient presents with  . Chest Pain    follow up  . mixed hyperlipidemia    HPI  59 y.o. Caucasian male with uncontrolled hypertension, hyperlipidemia, h/o DVT  Patient had Covid pneumonia in January 2021. He is recovering from it. He has some residaul shortness of breath, but denies chest pain. He has not been on statin, as he has been out of it. Blood pressure is well controlled.    Current Outpatient Medications on File Prior to Visit  Medication Sig Dispense Refill  . albuterol (VENTOLIN HFA) 108 (90 Base) MCG/ACT inhaler Inhale 2 puffs into the lungs every 6 (six) hours as needed for wheezing or shortness of breath. 18 g 0  . amLODipine (NORVASC) 5 MG tablet Take 1 tablet (5 mg total) by mouth daily. 90 tablet 3  . carvedilol (COREG) 12.5 MG tablet TAKE 1 TABLET (12.5 MG TOTAL) BY MOUTH 2 (TWO) TIMES DAILY. (Patient taking differently: Take 12.5 mg by mouth daily. ) 180 tablet 1  . EDARBYCLOR 40-12.5 MG TABS TAKE ONE TABLET BY MOUTH DAILY  (Patient taking differently: Take 1 tablet by mouth at bedtime. ) 30 tablet 11  . Multiple Vitamins-Minerals (MULTIVITAMIN WITH MINERALS) tablet Take 1 tablet by mouth daily.    . nitroGLYCERIN (NITROSTAT) 0.4 MG SL tablet Place 1 tablet (0.4 mg total) under the tongue every 5 (five) minutes as needed for chest pain. 50 tablet 3  . omeprazole (PRILOSEC) 20 MG capsule Take 20 mg by mouth daily.    . vitamin C (ASCORBIC ACID) 500 MG tablet Take 500 mg by mouth daily.    Marland Kitchen zinc gluconate 50 MG tablet Take 50 mg by mouth daily.    Marland Kitchen zinc sulfate 220 (50 Zn) MG capsule Take 1 capsule (220 mg total) by mouth daily. 30 capsule 0   No current facility-administered medications on file prior to visit.    Cardiovascular studies:  EKG 01/16/2020: Sinus rhythm 83 bpm.  Nonspecific  T-abnormality.   Lexiscan Sestamibi Stress Test 06/13/2019: Stress EKG is non-diagnostic, as this is pharmacological stress test. Myocardial perfusion imaging is normal. Left ventricular ejection fraction is  61% with normal wall motion. Low risk study.  Echocardiogram 06/09/2019:  Left ventricle cavity is normal in size. Mild concentric hypertrophy of  the left ventricle. Normal LV systolic function with EF 66%. Normal global  wall motion. Normal diastolic filling pattern. Calculated EF 66%.  Mild to moderate mitral regurgitation.  Mild tricuspid regurgitation. Estimated pulmonary artery systolic pressure  is 22 mmHg.   Recent labs: 12/13/2019: Glucose 257, BUN/Cr 22/0.83. EGFR >60. Na/K 135/3.6. AST/ALT 42/90. Albumin 2.9. Rest of the CMP normal H/H 12.9/38.6. MCV 84. Platelets 336  TG 79  Results for VINAL, ROSENGRANT (MRN [id number]) as of 01/16/2020 11:15  Ref. Range 12/10/2019 02:53 12/11/2019 02:56 12/12/2019 03:26 12/13/2019 03:44  D-Dimer, Quant Latest Ref Range: 0.00 - 0.50 ug/mL-FEU 1.19 (H) 1.48 (H) 0.83 (H) 0.77 (H)   Results for KADARRIUS, YANKE (MRN [id number]) as of 01/16/2020 11:15  Ref. Range 12/10/2019 02:53 12/11/2019 02:56 12/12/2019 03:26 12/13/2019 03:44  Ferritin Latest Ref Range: 24 - 336 ng/mL 1,449 (H) 1,318 (H) 1,309 (H) 993 (H)    04/2019: Chol 227, TG 178, HDL 46, LDL 145    Review of Systems  Review of Systems  Cardiovascular: Negative for chest pain, dyspnea on exertion, leg swelling, palpitations and syncope.     Vitals:   01/16/20 1523  BP: 117/88  Pulse: 94  Temp: (!) 97.3 F (36.3 C)     Objective:   Physical Exam  Physical Exam  Constitutional: He appears well-developed and well-nourished.  Neck: No JVD present.  Cardiovascular: Normal rate, regular rhythm, normal heart sounds and intact distal pulses.  No murmur heard. Pulmonary/Chest: Effort normal and breath sounds normal. He has no wheezes. He has no rales.  Musculoskeletal:        General:  No edema.  Nursing note and vitals reviewed.       Assessment & Recommendations:   59 y.o. Caucasian male with uncontrolled hypertension, hyperlipidemia, h/o DVT  Hypertension: Well controlled No angina recurrence. .  Hyperlipidemia: Resume Crestor 20 mg. Repeat lipid panel and f/u in 6 months. If lipid well controlled at that time, will see him on as needed basis.    Nigel Mormon, MD [facility] Pager: [phone] Office: [phone] If no answer Cell [phone]

## 2020-01-18 ENCOUNTER — Other Ambulatory Visit: Payer: Self-pay | Admitting: Family Medicine

## 2020-01-18 DIAGNOSIS — J1282 Pneumonia due to coronavirus disease 2019: Secondary | ICD-10-CM

## 2020-01-18 DIAGNOSIS — U071 COVID-19: Secondary | ICD-10-CM

## 2020-01-18 NOTE — Telephone Encounter (Signed)
[facility] is requesting to fill pt albuterol inhaler. Please advise Troy Garza [facility]

## 2020-05-28 ENCOUNTER — Other Ambulatory Visit: Payer: Self-pay | Admitting: Family Medicine

## 2020-05-28 DIAGNOSIS — I10 Essential (primary) hypertension: Secondary | ICD-10-CM

## 2020-06-12 ENCOUNTER — Telehealth: Payer: Self-pay

## 2020-06-12 NOTE — Telephone Encounter (Signed)
LVM advising pt it is time to schedule a CPE with Dr. Redmond School. [place]

## 2020-07-18 ENCOUNTER — Other Ambulatory Visit: Payer: Self-pay | Admitting: Family Medicine

## 2020-07-18 DIAGNOSIS — I10 Essential (primary) hypertension: Secondary | ICD-10-CM

## 2020-08-10 ENCOUNTER — Ambulatory Visit: Payer: BC Managed Care – PPO | Admitting: Cardiology

## 2020-08-10 NOTE — Progress Notes (Deleted)
Patient referred by Denita Lung, MD for chest pain  Subjective:   Troy Garza, male    DOB: 1960/12/12, 59 y.o.   MRN: [id number]    No chief complaint on file.   HPI  59 y.o. Caucasian male with uncontrolled hypertension, hyperlipidemia, h/o DVT  Patient had Covid pneumonia in January 2021. He is recovering from it. He has some residaul shortness of breath, but denies chest pain. He has not been on statin, as he has been out of it. Blood pressure is well controlled.    Current Outpatient Medications on File Prior to Visit  Medication Sig Dispense Refill  . albuterol (VENTOLIN HFA) 108 (90 Base) MCG/ACT inhaler INHALE 2 PUFFS INTO THE LUNGS EVERY 6 HOURS AS NEEDED FOR WHEEZING/SHORTNESS OF BREATH 18 g 0  . amLODipine (NORVASC) 5 MG tablet TAKE 1 TABLET BY MOUTH EVERY DAY 90 tablet 0  . carvedilol (COREG) 12.5 MG tablet TAKE 1 TABLET (12.5 MG TOTAL) BY MOUTH 2 (TWO) TIMES DAILY. (Patient taking differently: Take 12.5 mg by mouth daily. ) 180 tablet 1  . EDARBYCLOR 40-12.5 MG TABS TAKE 1 TABLET BY MOUTH DAILY 30 tablet 0  . Multiple Vitamins-Minerals (MULTIVITAMIN WITH MINERALS) tablet Take 1 tablet by mouth daily.    . nitroGLYCERIN (NITROSTAT) 0.4 MG SL tablet Place 1 tablet (0.4 mg total) under the tongue every 5 (five) minutes as needed for chest pain. 50 tablet 3  . omeprazole (PRILOSEC) 20 MG capsule Take 20 mg by mouth daily.    . rosuvastatin (CRESTOR) 20 MG tablet Take 1 tablet (20 mg total) by mouth daily. 90 tablet 3  . vitamin C (ASCORBIC ACID) 500 MG tablet Take 500 mg by mouth daily.    Marland Kitchen zinc gluconate 50 MG tablet Take 50 mg by mouth daily.    Marland Kitchen zinc sulfate 220 (50 Zn) MG capsule Take 1 capsule (220 mg total) by mouth daily. 30 capsule 0   No current facility-administered medications on file prior to visit.    Cardiovascular studies:  EKG 01/16/2020: Sinus rhythm 83 bpm.  Nonspecific T-abnormality.   Lexiscan Sestamibi Stress Test 06/13/2019: Stress EKG  is non-diagnostic, as this is pharmacological stress test. Myocardial perfusion imaging is normal. Left ventricular ejection fraction is  61% with normal wall motion. Low risk study.  Echocardiogram 06/09/2019:  Left ventricle cavity is normal in size. Mild concentric hypertrophy of  the left ventricle. Normal LV systolic function with EF 66%. Normal global  wall motion. Normal diastolic filling pattern. Calculated EF 66%.  Mild to moderate mitral regurgitation.  Mild tricuspid regurgitation. Estimated pulmonary artery systolic pressure  is 22 mmHg.   Recent labs: 12/13/2019: Glucose 257, BUN/Cr 22/0.83. EGFR >60. Na/K 135/3.6. AST/ALT 42/90. Albumin 2.9. Rest of the CMP normal H/H 12.9/38.6. MCV 84. Platelets 336  TG 79  Results for Troy, Garza (MRN [id number]) as of 01/16/2020 11:15  Ref. Range 12/10/2019 02:53 12/11/2019 02:56 12/12/2019 03:26 12/13/2019 03:44  D-Dimer, Quant Latest Ref Range: 0.00 - 0.50 ug/mL-FEU 1.19 (H) 1.48 (H) 0.83 (H) 0.77 (H)   Results for Troy, Garza (MRN [id number]) as of 01/16/2020 11:15  Ref. Range 12/10/2019 02:53 12/11/2019 02:56 12/12/2019 03:26 12/13/2019 03:44  Ferritin Latest Ref Range: 24 - 336 ng/mL 1,449 (H) 1,318 (H) 1,309 (H) 993 (H)    04/2019: Chol 227, TG 178, HDL 46, LDL 145    Review of Systems  Review of Systems  Cardiovascular: Negative for chest pain, dyspnea on exertion, leg swelling, palpitations and  syncope.    *** There were no vitals filed for this visit.   Objective:   Physical Exam  Physical Exam Vitals and nursing note reviewed.  Constitutional:      Appearance: He is well-developed.  Neck:     Vascular: No JVD.  Cardiovascular:     Rate and Rhythm: Normal rate and regular rhythm.     Pulses: Intact distal pulses.     Heart sounds: Normal heart sounds. No murmur heard.   Pulmonary:     Effort: Pulmonary effort is normal.     Breath sounds: Normal breath sounds. No wheezing or rales.         Assessment &  Recommendations:   59 y.o. Caucasian male with uncontrolled hypertension, hyperlipidemia, h/o DVT  Hypertension: Well controlled No angina recurrence. .  Hyperlipidemia: Resume Crestor 20 mg. Repeat lipid panel and f/u in 6 months. If lipid well controlled at that time, will see him on as needed basis.    Nigel Mormon, MD [facility] Pager: [phone] Office: [phone] If no answer Cell [phone]

## 2020-08-15 ENCOUNTER — Other Ambulatory Visit: Payer: Self-pay | Admitting: Family Medicine

## 2020-08-15 DIAGNOSIS — I10 Essential (primary) hypertension: Secondary | ICD-10-CM

## 2020-09-08 ENCOUNTER — Other Ambulatory Visit: Payer: Self-pay | Admitting: Family Medicine

## 2020-09-08 DIAGNOSIS — I10 Essential (primary) hypertension: Secondary | ICD-10-CM

## 2020-09-17 ENCOUNTER — Other Ambulatory Visit: Payer: Self-pay

## 2020-09-17 ENCOUNTER — Ambulatory Visit (INDEPENDENT_AMBULATORY_CARE_PROVIDER_SITE_OTHER): Payer: BC Managed Care – PPO | Admitting: Family Medicine

## 2020-09-17 ENCOUNTER — Encounter: Payer: Self-pay | Admitting: Family Medicine

## 2020-09-17 VITALS — BP 144/90 | HR 74 | Temp 98.5°F | Wt 210.2 lb

## 2020-09-17 DIAGNOSIS — Z0000 Encounter for general adult medical examination without abnormal findings: Secondary | ICD-10-CM | POA: Diagnosis not present

## 2020-09-17 DIAGNOSIS — Z23 Encounter for immunization: Secondary | ICD-10-CM | POA: Diagnosis not present

## 2020-09-17 DIAGNOSIS — D229 Melanocytic nevi, unspecified: Secondary | ICD-10-CM

## 2020-09-17 DIAGNOSIS — I10 Essential (primary) hypertension: Secondary | ICD-10-CM | POA: Diagnosis not present

## 2020-09-17 DIAGNOSIS — E291 Testicular hypofunction: Secondary | ICD-10-CM | POA: Diagnosis not present

## 2020-09-17 DIAGNOSIS — E785 Hyperlipidemia, unspecified: Secondary | ICD-10-CM

## 2020-09-17 DIAGNOSIS — Z1211 Encounter for screening for malignant neoplasm of colon: Secondary | ICD-10-CM

## 2020-09-17 LAB — LIPID PANEL

## 2020-09-17 MED ORDER — AMLODIPINE BESYLATE 5 MG PO TABS: 5.0000 mg | ORAL_TABLET | Freq: Every day | ORAL | 3 refills | Status: DC

## 2020-09-17 MED ORDER — CARVEDILOL 25 MG PO TABS: 25.0000 mg | ORAL_TABLET | Freq: Two times a day (BID) | ORAL | 3 refills | Status: DC

## 2020-09-17 MED ORDER — EDARBYCLOR 40-12.5 MG PO TABS: 1.0000 | ORAL_TABLET | Freq: Every day | ORAL | 3 refills | Status: DC

## 2020-09-17 NOTE — Progress Notes (Signed)
   Subjective:    Patient ID: Troy Garza, male    DOB: 11/29/1960, 59 y.o.   MRN: [id number]  HPI He is here for complete examination.  He does have some moles present on the left cheek that are starting to become a nuisance and he would like them removed.  He continues on his blood pressure medications and is having no difficulty with that.  He does have a history of hyperlipidemia.  He is presently not dating anybody.  He does have history of hypogonadism but is not on any medications.  He has no other concerns or complaints.  Family and social history as well as health maintenance and immunizations was reviewed   Review of Systems  All other systems reviewed and are negative.      Objective:   Physical Exam Alert and in no distress.  Two 1 cm slightly vascular pigmented lesions are noted with well-demarcated borders on the left cheek.  Tympanic membranes and canals are normal. Pharyngeal area is normal. Neck is supple without adenopathy or thyromegaly. Cardiac exam shows a regular sinus rhythm without murmurs or gallops. Lungs are clear to auscultation. Abdominal exam shows no masses or tenderness with normal bowel sounds      Assessment & Plan:  Routine general medical examination at a health care facility - Plan: CBC with Differential/Platelet, Comprehensive metabolic panel, Lipid panel  Numerous moles - Plan: Ambulatory referral to Dermatology  Essential hypertension - Plan: carvedilol (COREG) 25 MG tablet, amLODipine (NORVASC) 5 MG tablet, EDARBYCLOR 40-12.5 MG TABS  Male hypogonadism - Plan: Testosterone  Hyperlipidemia, unspecified hyperlipidemia type - Plan: Lipid panel  Need for influenza vaccination - Plan: Flu Vaccine QUAD 6+ mos PF IM (Fluarix Quad PF)  Screening for colon cancer - Plan: Cologuard I did increase his blood pressure medication and will need to check that again in 1 month.  I encouraged him to take good care of himself.

## 2020-09-18 LAB — LIPID PANEL
Chol/HDL Ratio: 4.9 ratio (ref 0.0–5.0)
Cholesterol, Total: 229 mg/dL — ABNORMAL HIGH (ref 100–199)
HDL: 47 mg/dL (ref >39)
LDL Chol Calc (NIH): 155 mg/dL — ABNORMAL HIGH (ref 0–99)
Triglycerides: 149 mg/dL (ref 0–149)
VLDL Cholesterol Cal: 27 mg/dL (ref 5–40)

## 2020-09-18 LAB — CBC WITH DIFFERENTIAL/PLATELET
Basophils Absolute: 0.1 10*3/uL (ref 0.0–0.2)
Basos: 1 %
EOS (ABSOLUTE): 0.3 10*3/uL (ref 0.0–0.4)
Eos: 4 %
Hematocrit: 44.9 % (ref 37.5–51.0)
Hemoglobin: 15.2 g/dL (ref 13.0–17.7)
Immature Grans (Abs): 0.1 10*3/uL (ref 0.0–0.1)
Immature Granulocytes: 1 %
Lymphocytes Absolute: 1.6 10*3/uL (ref 0.7–3.1)
Lymphs: 22 %
MCH: 29.1 pg (ref 26.6–33.0)
MCHC: 33.9 g/dL (ref 31.5–35.7)
MCV: 86 fL (ref 79–97)
Monocytes Absolute: 0.5 10*3/uL (ref 0.1–0.9)
Monocytes: 7 %
Neutrophils Absolute: 4.6 10*3/uL (ref 1.4–7.0)
Neutrophils: 65 %
Platelets: 211 10*3/uL (ref 150–450)
RBC: 5.22 x10E6/uL (ref 4.14–5.80)
RDW: 12.8 % (ref 11.6–15.4)
WBC: 7.2 10*3/uL (ref 3.4–10.8)

## 2020-09-18 LAB — COMPREHENSIVE METABOLIC PANEL
ALT: 33 IU/L (ref 0–44)
AST: 18 IU/L (ref 0–40)
Albumin/Globulin Ratio: 1.9 (ref 1.2–2.2)
Albumin: 4.6 g/dL (ref 3.8–4.9)
Alkaline Phosphatase: 72 IU/L (ref 44–121)
BUN/Creatinine Ratio: 20 (ref 9–20)
BUN: 19 mg/dL (ref 6–24)
Bilirubin Total: 0.3 mg/dL (ref 0.0–1.2)
CO2: 24 mmol/L (ref 20–29)
Calcium: 9.9 mg/dL (ref 8.7–10.2)
Chloride: 105 mmol/L (ref 96–106)
Creatinine, Ser: 0.93 mg/dL (ref 0.76–1.27)
GFR calc Af Amer: 105 mL/min/{1.73_m2} (ref >59)
GFR calc non Af Amer: 91 mL/min/{1.73_m2} (ref >59)
Globulin, Total: 2.4 g/dL (ref 1.5–4.5)
Glucose: 138 mg/dL — ABNORMAL HIGH (ref 65–99)
Potassium: 4.2 mmol/L (ref 3.5–5.2)
Sodium: 141 mmol/L (ref 134–144)
Total Protein: 7 g/dL (ref 6.0–8.5)

## 2020-09-18 LAB — TESTOSTERONE: Testosterone: 276 ng/dL (ref 264–916)

## 2020-09-24 ENCOUNTER — Telehealth (INDEPENDENT_AMBULATORY_CARE_PROVIDER_SITE_OTHER): Payer: BC Managed Care – PPO | Admitting: Family Medicine

## 2020-09-24 ENCOUNTER — Other Ambulatory Visit: Payer: Self-pay

## 2020-09-24 ENCOUNTER — Encounter: Payer: Self-pay | Admitting: Family Medicine

## 2020-09-24 VITALS — Ht 68.0 in | Wt 205.0 lb

## 2020-09-24 DIAGNOSIS — E7439 Other disorders of intestinal carbohydrate absorption: Secondary | ICD-10-CM

## 2020-09-24 DIAGNOSIS — E291 Testicular hypofunction: Secondary | ICD-10-CM

## 2020-09-24 DIAGNOSIS — E782 Mixed hyperlipidemia: Secondary | ICD-10-CM | POA: Diagnosis not present

## 2020-09-24 MED ORDER — ROSUVASTATIN CALCIUM 20 MG PO TABS: 20.0000 mg | ORAL_TABLET | Freq: Every day | ORAL | 3 refills | Status: DC

## 2020-09-24 NOTE — Progress Notes (Signed)
   Subjective:    Patient ID: Troy Garza, male    DOB: 1961-09-25, 59 y.o.   MRN: [id number]  HPI I connected with  [name] on 09/24/20 by a video enabled telemedicine application and verified that I am speaking with the correct person using two identifiers.  Caregility used.  I am in my office he is at home I discussed the limitations of evaluation and management by telemedicine. The patient expressed understanding and agreed to proceed. Recent blood work did show elevated blood sugar as well as LDL cholesterol and low testosterone.  He states he did run out of his Crestor.   Review of Systems     Objective:   Physical Exam Alert and in no distress otherwise not examined       Assessment & Plan:  Male hypogonadism - Plan: Testosterone  Mixed hyperlipidemia - Plan: rosuvastatin (CRESTOR) 20 MG tablet  Glucose intolerance  I discussed the diagnosis of glucose intolerance with him.  Encouraged him to continue with his physically active lifestyle but also make changes and cutting back on carbohydrates and gave examples of that. Then discussed his Crestor and I will renew that.  He is to take this on a daily basis. He has not started taking the testosterone but plans to start it in the near future.  He will pick up the prescription and start on this.  Schedule him for blood work in 1 month.

## 2020-10-10 LAB — SPECIMEN STATUS REPORT

## 2020-10-10 LAB — HGB A1C W/O EAG: Hgb A1c MFr Bld: 6.1 % — ABNORMAL HIGH (ref 4.8–5.6)

## 2020-10-19 ENCOUNTER — Other Ambulatory Visit: Payer: BC Managed Care – PPO

## 2020-10-19 DIAGNOSIS — Z1211 Encounter for screening for malignant neoplasm of colon: Secondary | ICD-10-CM | POA: Diagnosis not present

## 2020-10-19 LAB — COLOGUARD: Cologuard: NEGATIVE

## 2020-10-26 LAB — COLOGUARD: COLOGUARD: NEGATIVE

## 2020-11-02 NOTE — Progress Notes (Signed)
LVM of negative cologuard [facility]

## 2021-04-28 ENCOUNTER — Emergency Department (HOSPITAL_BASED_OUTPATIENT_CLINIC_OR_DEPARTMENT_OTHER): Payer: BC Managed Care – PPO

## 2021-04-28 ENCOUNTER — Other Ambulatory Visit: Payer: Self-pay

## 2021-04-28 ENCOUNTER — Encounter (HOSPITAL_BASED_OUTPATIENT_CLINIC_OR_DEPARTMENT_OTHER): Payer: Self-pay | Admitting: Obstetrics and Gynecology

## 2021-04-28 ENCOUNTER — Emergency Department (HOSPITAL_BASED_OUTPATIENT_CLINIC_OR_DEPARTMENT_OTHER)
Admission: EM | Admit: 2021-04-28 | Discharge: 2021-04-28 | Disposition: A | Discharge status: Home / Self Care | Payer: BC Managed Care – PPO | Attending: Emergency Medicine | Admitting: Emergency Medicine

## 2021-04-28 DIAGNOSIS — I10 Essential (primary) hypertension: Secondary | ICD-10-CM | POA: Insufficient documentation

## 2021-04-28 DIAGNOSIS — Z79899 Other long term (current) drug therapy: Secondary | ICD-10-CM | POA: Insufficient documentation

## 2021-04-28 DIAGNOSIS — R1032 Left lower quadrant pain: Secondary | ICD-10-CM | POA: Diagnosis not present

## 2021-04-28 DIAGNOSIS — K5792 Diverticulitis of intestine, part unspecified, without perforation or abscess without bleeding: Secondary | ICD-10-CM | POA: Diagnosis not present

## 2021-04-28 DIAGNOSIS — R109 Unspecified abdominal pain: Secondary | ICD-10-CM | POA: Diagnosis not present

## 2021-04-28 HISTORY — DX: Unspecified abdominal hernia without obstruction or gangrene: K46.9

## 2021-04-28 LAB — CBC
HCT: 45 % (ref 39.0–52.0)
Hemoglobin: 15.2 g/dL (ref 13.0–17.0)
MCH: 28.3 pg (ref 26.0–34.0)
MCHC: 33.8 g/dL (ref 30.0–36.0)
MCV: 83.8 fL (ref 80.0–100.0)
Platelets: 198 10*3/uL (ref 150–400)
RBC: 5.37 MIL/uL (ref 4.22–5.81)
RDW: 12.7 % (ref 11.5–15.5)
WBC: 9.3 10*3/uL (ref 4.0–10.5)
nRBC: 0 % (ref 0.0–0.2)

## 2021-04-28 LAB — COMPREHENSIVE METABOLIC PANEL
ALT: 17 U/L (ref 0–44)
AST: 21 U/L (ref 15–41)
Albumin: 4.3 g/dL (ref 3.5–5.0)
Alkaline Phosphatase: 72 U/L (ref 38–126)
Anion gap: 11 (ref 5–15)
BUN: 17 mg/dL (ref 6–20)
CO2: 24 mmol/L (ref 22–32)
Calcium: 9 mg/dL (ref 8.9–10.3)
Chloride: 104 mmol/L (ref 98–111)
Creatinine, Ser: 0.98 mg/dL (ref 0.61–1.24)
GFR, Estimated: >60 mL/min (ref >60)
Glucose, Bld: 139 mg/dL — ABNORMAL HIGH (ref 70–99)
Potassium: 3.9 mmol/L (ref 3.5–5.1)
Sodium: 139 mmol/L (ref 135–145)
Total Bilirubin: 0.5 mg/dL (ref 0.3–1.2)
Total Protein: 7.5 g/dL (ref 6.5–8.1)

## 2021-04-28 LAB — LIPASE, BLOOD: Lipase: 17 U/L (ref 11–51)

## 2021-04-28 MED ORDER — CIPROFLOXACIN HCL 500 MG PO TABS: 500.0000 mg | ORAL_TABLET | Freq: Two times a day (BID) | ORAL | 0 refills | Status: DC

## 2021-04-28 MED ORDER — METRONIDAZOLE 500 MG PO TABS: 500.0000 mg | ORAL_TABLET | Freq: Two times a day (BID) | ORAL | 0 refills | Status: DC

## 2021-04-28 MED ORDER — CIPROFLOXACIN HCL 500 MG PO TABS
500.0000 mg | ORAL_TABLET | Freq: Once | ORAL | Status: AC
  Administered 2021-04-28: 500 mg via ORAL
  Filled 2021-04-28: qty 1

## 2021-04-28 MED ORDER — METRONIDAZOLE 500 MG PO TABS
500.0000 mg | ORAL_TABLET | Freq: Once | ORAL | Status: AC
  Administered 2021-04-28: 500 mg via ORAL
  Filled 2021-04-28: qty 1

## 2021-04-28 NOTE — ED Provider Notes (Signed)
[facility] EMERGENCY DEPT Provider Note   CSN: [id number] Arrival date & time: 04/28/21  1257     History Chief Complaint  Patient presents with   Abdominal Pain    Troy Garza is a 60 y.o. male.  He is here with a complaint of left lower quadrant abdominal pain that is been there for 3 days.  Not associate with any nausea vomiting diarrhea constipation or urinary symptoms.  No fevers or chills.  Has had a colonoscopy in the past and was not told that he has diverticulosis.  Pain is worse with bending over.  Worse with palpation.  He denies 8 out of 10 although he does not think he needs anything for pain.  History of umbilical hernia repair.  The history is provided by the patient.  Abdominal Pain Pain location:  LLQ Pain quality: aching   Pain radiates to:  Does not radiate Pain severity:  Moderate Onset quality:  Gradual Duration:  3 days Timing:  Constant Progression:  Worsening Chronicity:  New Context: not trauma   Relieved by:  Nothing Worsened by:  Movement Ineffective treatments:  None tried Associated symptoms: no chest pain, no chills, no constipation, no cough, no diarrhea, no dysuria, no fever, no hematuria, no nausea, no shortness of breath, no sore throat and no vomiting       Past Medical History:  Diagnosis Date   ED (erectile dysfunction)    GERD (gastroesophageal reflux disease)    hx of   Hearing loss in left ear    Hernia of abdominal cavity    Hyperlipidemia    Hypertension    Neuromuscular disorder (HCC)    hiatal hernia   Vertigo    left ear drum burst as child and required multipkle surgeries    Patient Active Problem List   Diagnosis Date Noted   Glucose intolerance 09/24/2020   Male hypogonadism 09/17/2020   Elevated LFTs 12/09/2019   Hyperlipidemia 12/09/2019   Family history of factor V Leiden mutation 07/24/2016   History of tympanoplasty of left ear 09/03/2015   ED (erectile dysfunction) 07/26/2012   Essential  hypertension 06/24/2012    Past Surgical History:  Procedure Laterality Date   ear surgery Left    HERNIA REPAIR  as baby   LUMBAR DISC SURGERY  8 years ago   UMBILICAL HERNIA REPAIR  08/12/11       Family History  Problem Relation Age of Onset   Hypertension Father    Colon polyps Father    Heart disease Father    Brain cancer Father    Kidney cancer Father    Bone cancer Father    Liver cancer Father    Aortic dissection Mother    Colon cancer Neg Hx     Social History   Tobacco Use   Smoking status: Never   Smokeless tobacco: Never  Vaping Use   Vaping Use: Never used  Substance Use Topics   Alcohol use: Yes    Alcohol/week: 0.0 standard drinks    Comment: socially   Drug use: No    Home Medications Prior to Admission medications   Medication Sig Start Date End Date Taking? Authorizing Provider  albuterol (VENTOLIN HFA) 108 (90 Base) MCG/ACT inhaler INHALE 2 PUFFS INTO THE LUNGS EVERY 6 HOURS AS NEEDED FOR WHEEZING/SHORTNESS OF BREATH 01/18/20   Denita Lung, MD  amLODipine (NORVASC) 5 MG tablet Take 1 tablet (5 mg total) by mouth daily. 09/17/20   Denita Lung, MD  carvedilol (COREG) 25 MG tablet Take 1 tablet (25 mg total) by mouth 2 (two) times daily with a meal. 09/17/20   Denita Lung, MD  EDARBYCLOR 40-12.5 MG TABS Take 1 tablet by mouth daily. 09/17/20   Denita Lung, MD  Misc Natural Products (GLUCOSAMINE CHOND CMP ADVANCED PO) Take by mouth.    [provider]  Multiple Vitamins-Minerals (MULTIVITAMIN WITH MINERALS) tablet Take 1 tablet by mouth daily.    [provider]  nitroGLYCERIN (NITROSTAT) 0.4 MG SL tablet Place 1 tablet (0.4 mg total) under the tongue every 5 (five) minutes as needed for chest pain. 04/29/19   Denita Lung, MD  omeprazole (PRILOSEC) 20 MG capsule Take 20 mg by mouth daily.    [provider]  rosuvastatin (CRESTOR) 20 MG tablet Take 1 tablet (20 mg total) by mouth daily. 09/24/20 12/23/20   Denita Lung, MD  vitamin C (ASCORBIC ACID) 500 MG tablet Take 500 mg by mouth daily.    [provider]  VITAMIN D PO Take by mouth.    [provider]  zinc gluconate 50 MG tablet Take 50 mg by mouth daily.    [provider]    Allergies    Penicillins  Review of Systems   Review of Systems  Constitutional:  Negative for chills and fever.  HENT:  Negative for sore throat.   Eyes:  Negative for visual disturbance.  Respiratory:  Negative for cough and shortness of breath.   Cardiovascular:  Negative for chest pain.  Gastrointestinal:  Positive for abdominal pain. Negative for constipation, diarrhea, nausea and vomiting.  Genitourinary:  Negative for dysuria and hematuria.  Musculoskeletal:  Negative for neck pain.  Skin:  Negative for rash.  Neurological:  Negative for headaches.   Physical Exam Updated Vital Signs BP (!) 150/103 (BP Location: Right Arm)   Pulse 79   Temp 98.7 F (37.1 C) (Oral)   Resp 18   SpO2 97%   Physical Exam Vitals and nursing note reviewed.  Constitutional:      Appearance: He is well-developed.  HENT:     Head: Normocephalic and atraumatic.  Eyes:     Conjunctiva/sclera: Conjunctivae normal.  Cardiovascular:     Rate and Rhythm: Normal rate and regular rhythm.     Heart sounds: No murmur heard. Pulmonary:     Effort: Pulmonary effort is normal. No respiratory distress.     Breath sounds: Normal breath sounds.  Abdominal:     Palpations: Abdomen is soft.     Tenderness: There is abdominal tenderness in the left lower quadrant. There is no guarding or rebound.     Hernia: No hernia is present.  Musculoskeletal:        General: No deformity or signs of injury.     Cervical back: Neck supple.  Skin:    General: Skin is warm and dry.  Neurological:     General: No focal deficit present.     Mental Status: He is alert.    ED Results / Procedures / Treatments   Labs (all labs ordered are listed, but only  abnormal results are displayed) Labs Reviewed  COMPREHENSIVE METABOLIC PANEL - Abnormal; Notable for the following components:      Result Value   Glucose, Bld 139 (*)    All other components within normal limits  LIPASE, BLOOD  CBC  URINALYSIS, ROUTINE W REFLEX MICROSCOPIC    EKG None  Radiology CT Abdomen Pelvis Wo Contrast  Result Date: 04/28/2021 CLINICAL DATA:  Left lower quadrant abdominal pain. EXAM: CT ABDOMEN AND PELVIS WITHOUT CONTRAST TECHNIQUE: Multidetector CT imaging of the abdomen and pelvis was performed following the standard protocol without IV contrast. COMPARISON:  None. FINDINGS: Lower chest: No acute abnormality. Hepatobiliary: No focal liver abnormality is seen. No gallstones, gallbladder wall thickening, or biliary dilatation. Pancreas: Unremarkable. No pancreatic ductal dilatation or surrounding inflammatory changes. Spleen: Normal in size without focal abnormality. Adrenals/Urinary Tract: Adrenal glands are unremarkable. Kidneys are normal, without renal calculi, focal lesion, or hydronephrosis. 2.2 cm posterior bladder diverticulum. Stomach/Bowel: Focal wall thickening at the junction of the descending and sigmoid colon adjacent to an inflamed diverticulum (series 2, image 62). No extraluminal air or drainable fluid collection. The stomach and small bowel are unremarkable. Normal appendix. Vascular/Lymphatic: Aortic atherosclerosis. No enlarged abdominal or pelvic lymph nodes. Reproductive: Mild prostatomegaly. Other: Small to moderate fat containing left inguinal hernia. No free fluid or pneumoperitoneum. Musculoskeletal: No acute or significant osseous findings. 4.2 cm subcutaneous cyst in the right paramidline buttock, likely a large sebaceous cyst. IMPRESSION: 1. Acute diverticulitis at the junction of the descending and sigmoid colon. No evidence of perforation or abscess. 2. Aortic Atherosclerosis (ICD10-I70.0). Electronically Signed   By: Titus Dubin M.D.   On:  04/28/2021 14:53    Procedures Procedures   Medications Ordered in ED Medications  ciprofloxacin (CIPRO) tablet 500 mg (500 mg Oral Given 04/28/21 1518)  metroNIDAZOLE (FLAGYL) tablet 500 mg (500 mg Oral Given 04/28/21 1517)    ED Course  I have reviewed the triage vital signs and the nursing notes.  Pertinent labs & imaging results that were available during my care of the patient were reviewed by me and considered in my medical decision making (see chart for details).    MDM Rules/Calculators/A&P                         This patient complains of left lower quadrant abdominal pain; this involves an extensive number of treatment Options and is a complaint that carries with it a high risk of complications and Morbidity. The differential includes diverticulitis, colitis, hernia, obstruction, musculoskeletal, pyelonephritis, renal colic  I ordered, reviewed and interpreted labs, which included CBC with normal white count normal hemoglobin, chemistries normal other than mildly elevated glucose, LFTs normal, urinalysis ordered but not obtained I ordered medication oral antibiotics I ordered imaging studies which included CT abdomen pelvis without contrast due to contrast shortage and I independently    visualized and interpreted imaging which showed acute focal diverticulitis without abscess Previous records obtained and reviewed in epic, no recent admissions  After the interventions stated above, I reevaluated the patient and found patient to be comfortable.  I reviewed results of work-up with him.  He feels he will do okay as an outpatient.  Discussed "liquid diet and advance as tolerated.  Antibiotics.  Return instructions discussed.   Final Clinical Impression(s) / ED Diagnoses Final diagnoses:  Acute diverticulitis    Rx / DC Orders ED Discharge Orders          Ordered    ciprofloxacin (CIPRO) 500 MG tablet  Every 12 hours        04/28/21 1525    metroNIDAZOLE (FLAGYL) 500 MG  tablet  2 times daily        04/28/21 1525             Hayden Rasmussen, MD 04/28/21 1720

## 2021-04-28 NOTE — Discharge Instructions (Addendum)
You were seen in the emergency department for left-sided abdominal pain.  Your CAT scan showed that you have acute diverticulitis.  This is treated with antibiotics and bowel rest.  This means clear liquid diet advance as tolerated.  Finish your antibiotics.  Follow-up with your primary care doctor.  Return to the emergency department for high fevers or worsening pain.

## 2021-04-28 NOTE — ED Notes (Signed)
Pt discharged. Pt verbalized understanding of dc instructions and medications.

## 2021-04-28 NOTE — ED Triage Notes (Signed)
Patient reports LLQ abdominal pain. Patient has a hx of multiple hernias. Patient denies urinary symptoms or blood in the stool. Patient denies hx of diverticulitis.

## 2021-04-28 NOTE — ED Notes (Signed)
No change in urination or bowels. Last normal BM PTA

## 2021-08-21 ENCOUNTER — Other Ambulatory Visit: Payer: Self-pay | Admitting: Family Medicine

## 2021-08-21 DIAGNOSIS — I10 Essential (primary) hypertension: Secondary | ICD-10-CM

## 2021-10-24 IMAGING — CT CT ABD-PELV W/O CM
2 of 4 series · 16 of 46 positions shown, 18 images · non-contrast
Comparison: None.

CLINICAL DATA: Left lower quadrant abdominal pain.

EXAM:
CT ABDOMEN AND PELVIS WITHOUT CONTRAST
TECHNIQUE: Multidetector CT imaging of the abdomen and pelvis was performed
following the standard protocol without IV contrast.

[Series 2: abd pel wo · axial · 0.82mm/px · z∈[+898,+1308]mm · 13 of 90 slices shown, 15 images]
[im 4/90  soft-tissue]
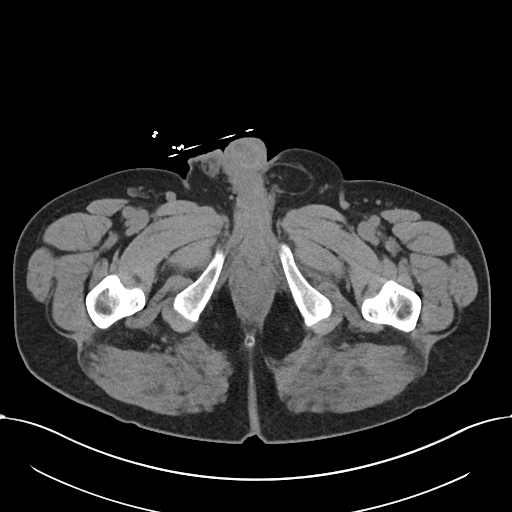
[im 4/90  bone]
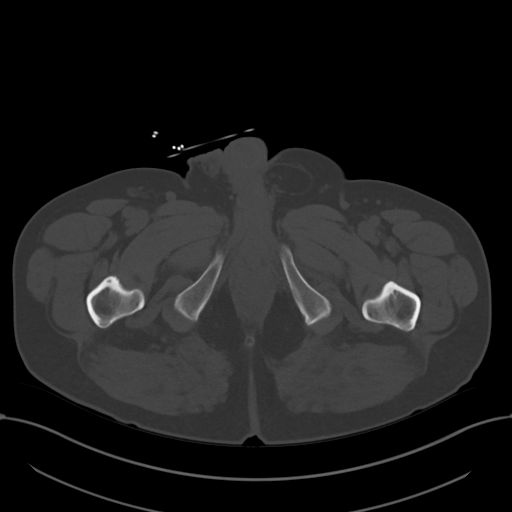
[im 11/90  soft-tissue]
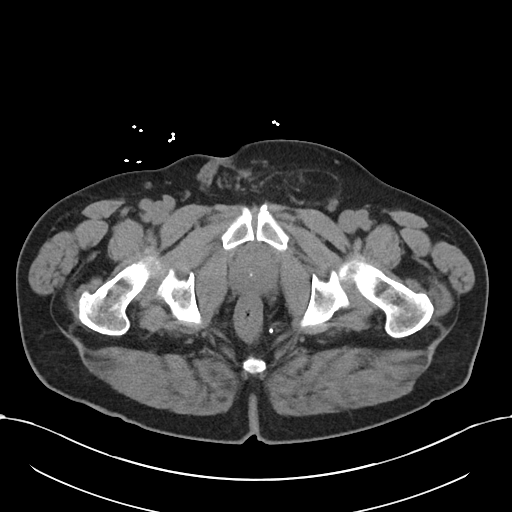
[im 18/90  soft-tissue]
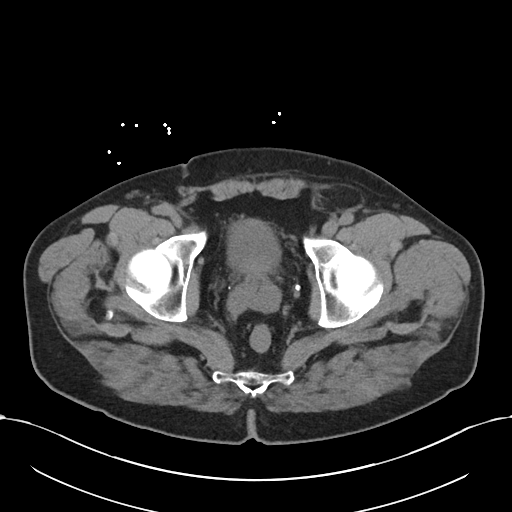
[im 24/90  soft-tissue]
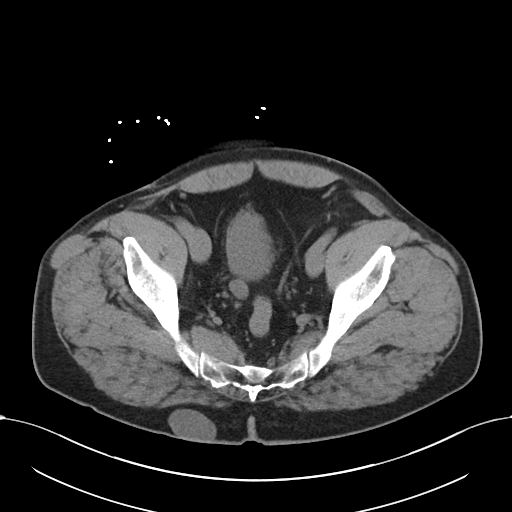
[im 31/90  soft-tissue]
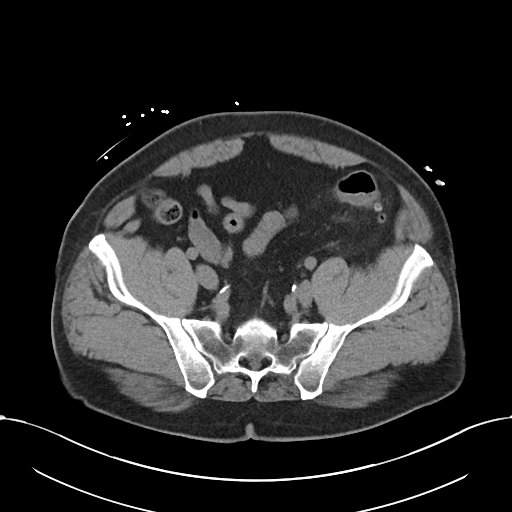
[im 38/90  soft-tissue]
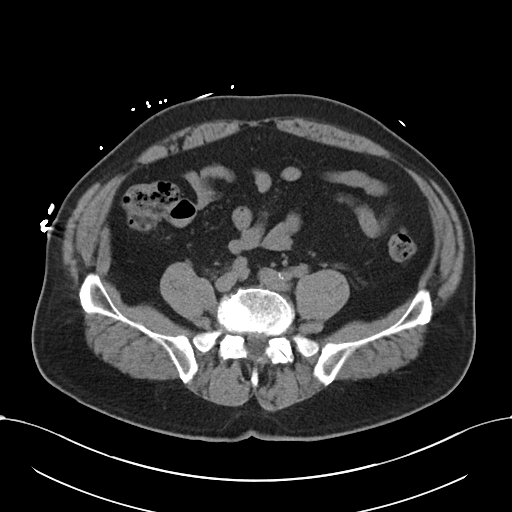
[im 45/90  soft-tissue]
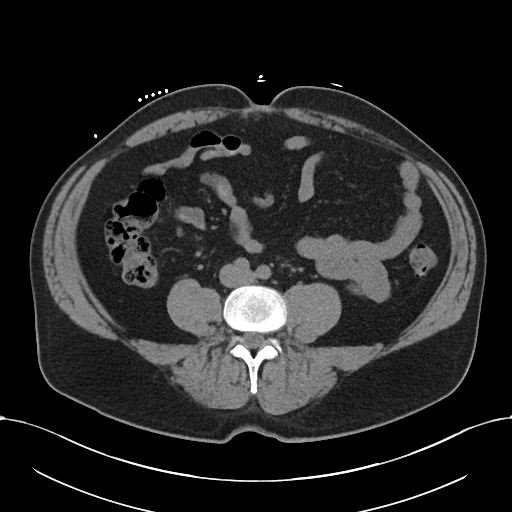
[im 52/90  soft-tissue]
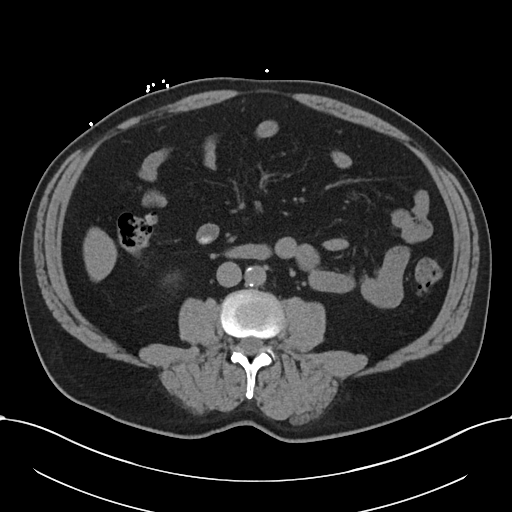
[im 59/90  soft-tissue]
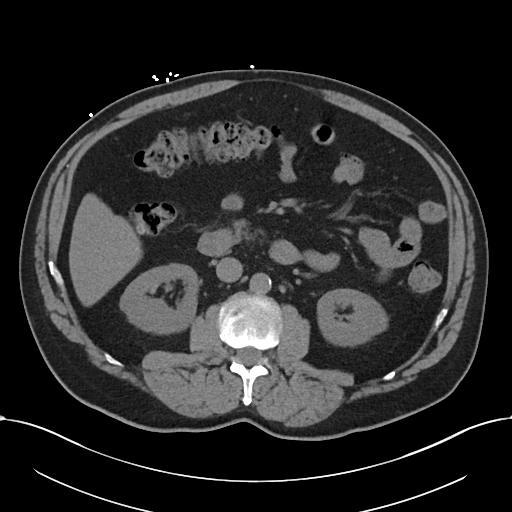
[im 59/90  bone]
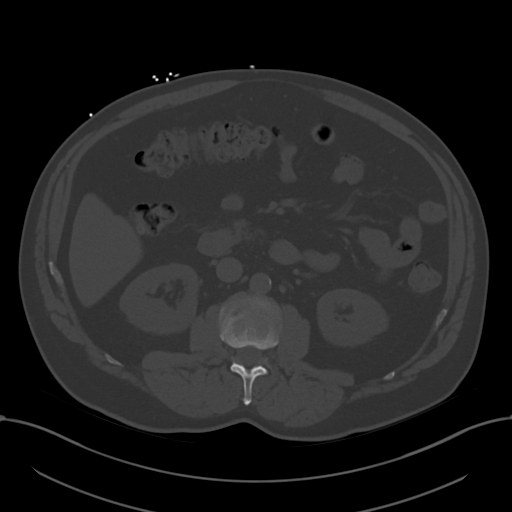
[im 66/90  soft-tissue]
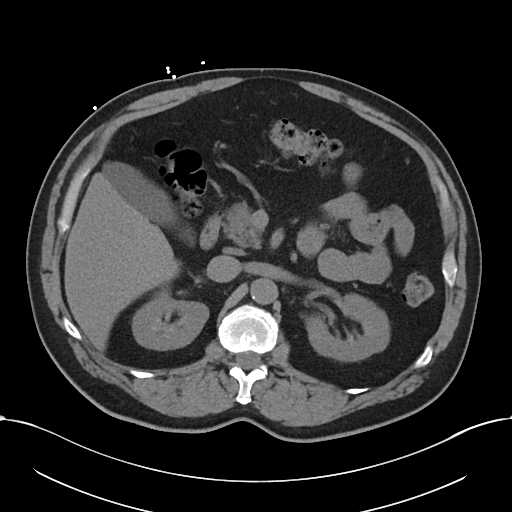
[im 72/90  soft-tissue]
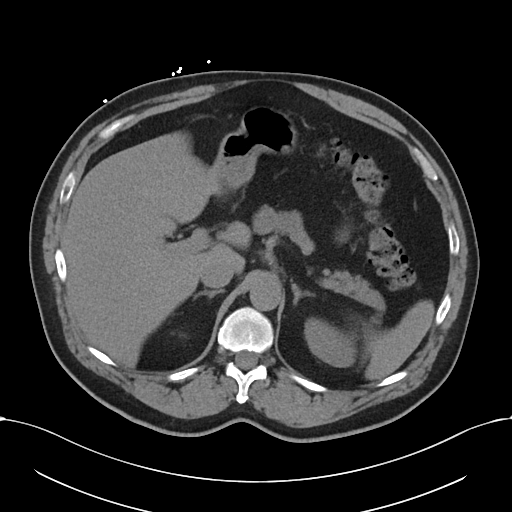
[im 79/90  soft-tissue]
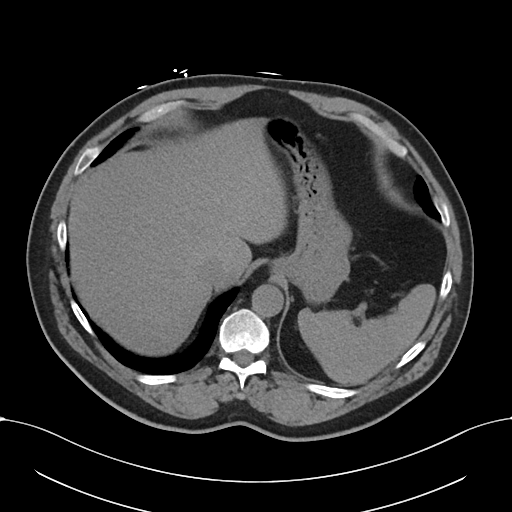
[im 86/90  soft-tissue]
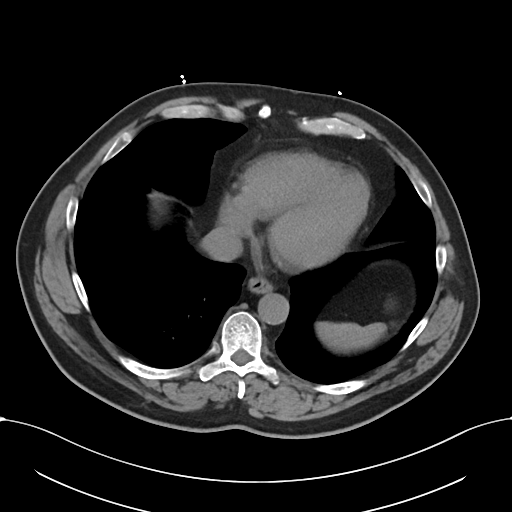

[Series 5: coronal · coronal · 0.78mm/px · 3 of 99 slices shown]
[im 33/99  soft-tissue]
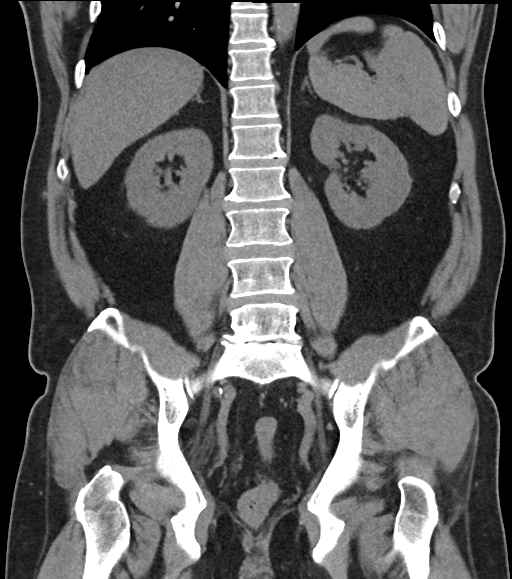
[im 44/99  soft-tissue]
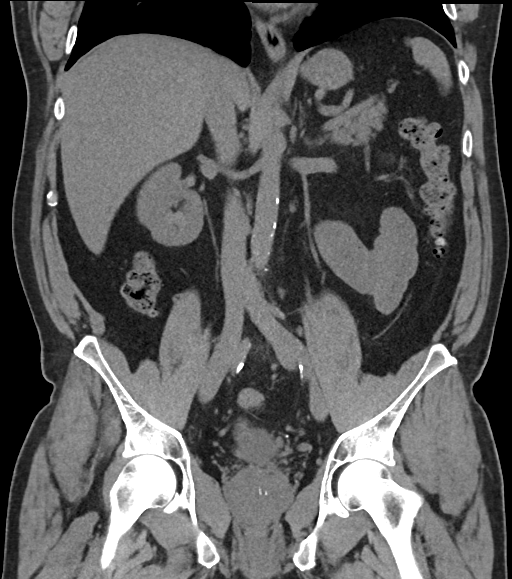
[im 55/99  soft-tissue]
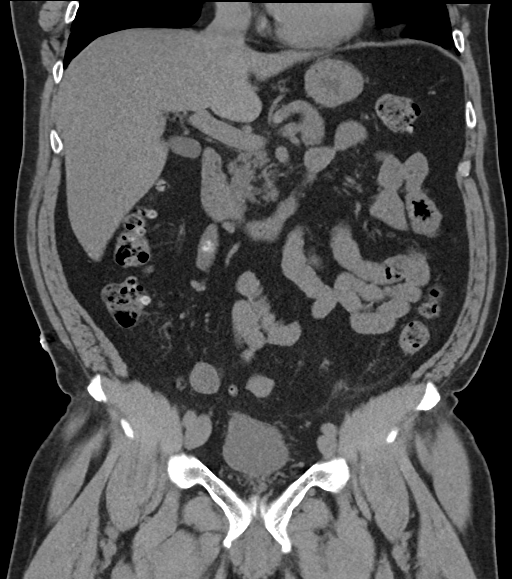

[16 of 46 positions shown; findings below may reference images not displayed]

FINDINGS: Lower chest: No acute abnormality.

Hepatobiliary: No focal liver abnormality is seen. No gallstones,
gallbladder wall thickening, or biliary dilatation.

Pancreas: Unremarkable. No pancreatic ductal dilatation or
surrounding inflammatory changes.

Spleen: Normal in size without focal abnormality.

Adrenals/Urinary Tract: Adrenal glands are unremarkable. Kidneys are
normal, without renal calculi, focal lesion, or hydronephrosis.
cm posterior bladder diverticulum.

Stomach/Bowel: Focal wall thickening at the junction of the
descending and sigmoid colon adjacent to an inflamed diverticulum
(series 2, image 62). No extraluminal air or drainable fluid
collection. The stomach and small bowel are unremarkable. Normal
appendix.

Vascular/Lymphatic: Aortic atherosclerosis. No enlarged abdominal or
pelvic lymph nodes.

Reproductive: Mild prostatomegaly.

Other: Small to moderate fat containing left inguinal hernia. No
free fluid or pneumoperitoneum.

Musculoskeletal: No acute or significant osseous findings. 4.2 cm
subcutaneous cyst in the right paramidline buttock, likely a large
sebaceous cyst.
IMPRESSION: 1. Acute diverticulitis at the junction of the descending and
sigmoid colon. No evidence of perforation or abscess.
2. Aortic Atherosclerosis ([id number]).

## 2021-11-27 ENCOUNTER — Other Ambulatory Visit: Payer: Self-pay | Admitting: Family Medicine

## 2021-11-27 DIAGNOSIS — E782 Mixed hyperlipidemia: Secondary | ICD-10-CM

## 2021-11-28 ENCOUNTER — Other Ambulatory Visit: Payer: Self-pay | Admitting: Family Medicine

## 2021-11-28 DIAGNOSIS — I10 Essential (primary) hypertension: Secondary | ICD-10-CM

## 2021-12-11 ENCOUNTER — Other Ambulatory Visit: Payer: Self-pay | Admitting: Family Medicine

## 2021-12-11 DIAGNOSIS — I10 Essential (primary) hypertension: Secondary | ICD-10-CM

## 2021-12-18 ENCOUNTER — Other Ambulatory Visit: Payer: Self-pay | Admitting: Family Medicine

## 2021-12-18 DIAGNOSIS — I10 Essential (primary) hypertension: Secondary | ICD-10-CM

## 2021-12-31 ENCOUNTER — Encounter: Payer: BC Managed Care – PPO | Admitting: Family Medicine

## 2022-01-15 ENCOUNTER — Other Ambulatory Visit: Payer: Self-pay | Admitting: Family Medicine

## 2022-01-15 DIAGNOSIS — I10 Essential (primary) hypertension: Secondary | ICD-10-CM

## 2022-02-06 ENCOUNTER — Encounter: Payer: BC Managed Care – PPO | Admitting: Family Medicine

## 2022-02-07 ENCOUNTER — Encounter: Payer: Self-pay | Admitting: Family Medicine

## 2022-02-24 ENCOUNTER — Ambulatory Visit (INDEPENDENT_AMBULATORY_CARE_PROVIDER_SITE_OTHER): Payer: BC Managed Care – PPO | Admitting: Family Medicine

## 2022-02-24 VITALS — BP 150/90 | HR 84 | Ht 69.0 in | Wt 205.6 lb

## 2022-02-24 DIAGNOSIS — E785 Hyperlipidemia, unspecified: Secondary | ICD-10-CM

## 2022-02-24 DIAGNOSIS — E782 Mixed hyperlipidemia: Secondary | ICD-10-CM | POA: Diagnosis not present

## 2022-02-24 DIAGNOSIS — Z9889 Other specified postprocedural states: Secondary | ICD-10-CM

## 2022-02-24 DIAGNOSIS — I700 Atherosclerosis of aorta: Secondary | ICD-10-CM

## 2022-02-24 DIAGNOSIS — Z1159 Encounter for screening for other viral diseases: Secondary | ICD-10-CM

## 2022-02-24 DIAGNOSIS — E291 Testicular hypofunction: Secondary | ICD-10-CM | POA: Diagnosis not present

## 2022-02-24 DIAGNOSIS — I10 Essential (primary) hypertension: Secondary | ICD-10-CM | POA: Diagnosis not present

## 2022-02-24 MED ORDER — CARVEDILOL 25 MG PO TABS: 25.0000 mg | ORAL_TABLET | Freq: Two times a day (BID) | ORAL | 3 refills | Status: DC

## 2022-02-24 MED ORDER — AMLODIPINE BESYLATE 5 MG PO TABS: 5.0000 mg | ORAL_TABLET | Freq: Every day | ORAL | 3 refills | Status: DC

## 2022-02-24 MED ORDER — EDARBYCLOR 40-12.5 MG PO TABS: 1.0000 | ORAL_TABLET | Freq: Every day | ORAL | 3 refills | Status: DC

## 2022-02-24 MED ORDER — ROSUVASTATIN CALCIUM 20 MG PO TABS: 20.0000 mg | ORAL_TABLET | Freq: Every day | ORAL | 3 refills | Status: DC

## 2022-02-24 NOTE — Progress Notes (Signed)
? ?  Subjective:  ? ? Patient ID: Troy Garza, male    DOB: May 07, 1961, 61 y.o.   MRN: [id number] ? ?HPI ?He is here for an interval evaluation.  He has not been seen here in over a year.  He has had recent left shoulder surgery from an injury at work and is still under the care of the surgeons.  He continues on Edarbychlor and is now taking Coreg once per day.  He is also taking amlodipine.  He did run out of some of these medications and needs them ordered.  He also has a history of hypogonadism but at this point says that his energy, strength and libido are all back to normal.  He states he did have a Cologuard test done within the last year.  He does have x-ray evidence of aortic atherosclerosis. ? ? ?Review of Systems ? ?   ?Objective:  ? Physical Exam ?Alert and in no distress. Tympanic membranes and canals are normal. Pharyngeal area is normal. Neck is supple without adenopathy or thyromegaly. Cardiac exam shows a regular sinus rhythm without murmurs or gallops. Lungs are clear to auscultation.  Review of his record indicates he does have a history of hyperlipidemia. ? ? ? ? ?   ?Assessment & Plan:  ?Essential hypertension - Plan: EDARBYCLOR 40-12.5 MG TABS, carvedilol (COREG) 25 MG tablet, amLODipine (NORVASC) 5 MG tablet, CBC with Differential/Platelet, Comprehensive metabolic panel ? ?Mixed hyperlipidemia - Plan: rosuvastatin (CRESTOR) 20 MG tablet ? ?Male hypogonadism - Plan: Testosterone ? ?Hyperlipidemia, unspecified hyperlipidemia type - Plan: Lipid panel ? ?Need for hepatitis C screening test - Plan: Hepatitis C antibody ? ?History of arthroscopy of left shoulder ? ?Aortic atherosclerosis ([place]) ?His blood pressure today is not at goal however he has not been all of his medications.  The medicine will be called in.  He is to return here in 1 month for recheck on his blood pressure. ? ?

## 2022-02-25 LAB — LIPID PANEL
Chol/HDL Ratio: 5.2 ratio — ABNORMAL HIGH (ref 0.0–5.0)
Cholesterol, Total: 230 mg/dL — ABNORMAL HIGH (ref 100–199)
HDL: 44 mg/dL (ref >39)
LDL Chol Calc (NIH): 143 mg/dL — ABNORMAL HIGH (ref 0–99)
Triglycerides: 238 mg/dL — ABNORMAL HIGH (ref 0–149)
VLDL Cholesterol Cal: 43 mg/dL — ABNORMAL HIGH (ref 5–40)

## 2022-02-25 LAB — CBC WITH DIFFERENTIAL/PLATELET
Basophils Absolute: 0.1 10*3/uL (ref 0.0–0.2)
Basos: 1 %
EOS (ABSOLUTE): 0.4 10*3/uL (ref 0.0–0.4)
Eos: 4 %
Hematocrit: 50.5 % (ref 37.5–51.0)
Hemoglobin: 16.8 g/dL (ref 13.0–17.7)
Immature Grans (Abs): 0.1 10*3/uL (ref 0.0–0.1)
Immature Granulocytes: 1 %
Lymphocytes Absolute: 1.9 10*3/uL (ref 0.7–3.1)
Lymphs: 21 %
MCH: 28 pg (ref 26.6–33.0)
MCHC: 33.3 g/dL (ref 31.5–35.7)
MCV: 84 fL (ref 79–97)
Monocytes Absolute: 0.9 10*3/uL (ref 0.1–0.9)
Monocytes: 10 %
Neutrophils Absolute: 5.6 10*3/uL (ref 1.4–7.0)
Neutrophils: 63 %
Platelets: 265 10*3/uL (ref 150–450)
RBC: 6.01 x10E6/uL — ABNORMAL HIGH (ref 4.14–5.80)
RDW: 12.8 % (ref 11.6–15.4)
WBC: 8.9 10*3/uL (ref 3.4–10.8)

## 2022-02-25 LAB — COMPREHENSIVE METABOLIC PANEL
ALT: 42 IU/L (ref 0–44)
AST: 24 IU/L (ref 0–40)
Albumin/Globulin Ratio: 2.1 (ref 1.2–2.2)
Albumin: 5 g/dL — ABNORMAL HIGH (ref 3.8–4.9)
Alkaline Phosphatase: 92 IU/L (ref 44–121)
BUN/Creatinine Ratio: 21 — ABNORMAL HIGH (ref 9–20)
BUN: 19 mg/dL (ref 6–24)
Bilirubin Total: 0.5 mg/dL (ref 0.0–1.2)
CO2: 22 mmol/L (ref 20–29)
Calcium: 10.1 mg/dL (ref 8.7–10.2)
Chloride: 102 mmol/L (ref 96–106)
Creatinine, Ser: 0.9 mg/dL (ref 0.76–1.27)
Globulin, Total: 2.4 g/dL (ref 1.5–4.5)
Glucose: 155 mg/dL — ABNORMAL HIGH (ref 70–99)
Potassium: 3.7 mmol/L (ref 3.5–5.2)
Sodium: 140 mmol/L (ref 134–144)
Total Protein: 7.4 g/dL (ref 6.0–8.5)
eGFR: 98 mL/min/{1.73_m2} (ref >59)

## 2022-02-25 LAB — TESTOSTERONE: Testosterone: 238 ng/dL — ABNORMAL LOW (ref 264–916)

## 2022-02-25 LAB — HEPATITIS C ANTIBODY: Hep C Virus Ab: NONREACTIVE

## 2022-03-04 ENCOUNTER — Other Ambulatory Visit: Payer: Self-pay | Admitting: Family Medicine

## 2022-03-04 DIAGNOSIS — E782 Mixed hyperlipidemia: Secondary | ICD-10-CM

## 2022-03-26 ENCOUNTER — Other Ambulatory Visit: Payer: Self-pay | Admitting: Family Medicine

## 2022-03-26 DIAGNOSIS — I10 Essential (primary) hypertension: Secondary | ICD-10-CM

## 2022-03-28 ENCOUNTER — Ambulatory Visit: Payer: BC Managed Care – PPO | Admitting: Family Medicine

## 2022-04-01 ENCOUNTER — Ambulatory Visit: Payer: BC Managed Care – PPO | Admitting: Family Medicine

## 2022-04-03 ENCOUNTER — Encounter: Payer: Self-pay | Admitting: Family Medicine

## 2022-04-08 ENCOUNTER — Other Ambulatory Visit: Payer: Self-pay | Admitting: Family Medicine

## 2022-04-08 DIAGNOSIS — I10 Essential (primary) hypertension: Secondary | ICD-10-CM

## 2022-04-11 ENCOUNTER — Telehealth (INDEPENDENT_AMBULATORY_CARE_PROVIDER_SITE_OTHER): Payer: BC Managed Care – PPO | Admitting: Physician Assistant

## 2022-04-11 ENCOUNTER — Encounter: Payer: Self-pay | Admitting: Physician Assistant

## 2022-04-11 VITALS — Temp 98.6°F | Wt 205.0 lb

## 2022-04-11 DIAGNOSIS — R058 Other specified cough: Secondary | ICD-10-CM

## 2022-04-11 MED ORDER — BENZONATATE 200 MG PO CAPS: 200.0000 mg | ORAL_CAPSULE | Freq: Two times a day (BID) | ORAL | 0 refills | Status: DC | PRN

## 2022-04-11 MED ORDER — AZITHROMYCIN 250 MG PO TABS: ORAL_TABLET | ORAL | 0 refills | Status: AC

## 2022-04-11 NOTE — Progress Notes (Addendum)
Start time: 2:04 pm End time: 2:27 pm  Virtual Visit via Video Note   Patient ID: Troy Garza, male    DOB: 10/05/1961, 61 y.o.   MRN: [id number]  I connected with above patient on 04/15/22 by a video enabled telemedicine application and verified that I am speaking with the correct person using two identifiers.  Location: Patient: home Provider: office   I discussed the limitations of evaluation and management by telemedicine and the availability of in person appointments. The patient expressed understanding and agreed to proceed.  History of Present Illness:  Chief Complaint  Patient presents with   Acute Visit    Virtual-had flu last week. Has a bad cough and congestion.    Reports a 1 1/2 week history of not feeling well; last week states he had a negative covid and then self diagnosed himself with the flu because he had symptoms that hit so fast with a sore throat, body aches, 101 fever, coughing now x 12 days, congestion, headache, body aches, denies asthma, denies tobacco use, denies allergic rhinitis, denies recent travel, denies sick contacts  Observations/Objective:  Temp 98.6 F (37 C)   Wt 205 lb (93 kg)   BMI 30.27 kg/m    Assessment: Encounter Diagnosis  Name Primary?   Cough productive of purulent sputum Yes     Plan: Purulent cough - You can take OTC cough suppressant as needed like dextromethorphan (DM) in any OTC cough medicine that has the abbreviation DM.   [name] was seen today for acute visit.  Diagnoses and all orders for this visit:  Cough productive of purulent sputum  Other orders -     azithromycin (ZITHROMAX) 250 MG tablet; Take 2 tablets on day 1, then 1 tablet daily on days 2 through 5 -     benzonatate (TESSALON) 200 MG capsule; Take 1 capsule (200 mg total) by mouth 2 (two) times daily as needed for cough.    Follow up: as needed   I discussed the assessment and treatment plan with the patient. The patient was provided an  opportunity to ask questions and all were answered. The patient agreed with the plan and demonstrated an understanding of the instructions.   The patient was advised to call back or seek an in-person evaluation if the symptoms worsen or if the condition fails to improve as anticipated. For emergencies go to Urgent Care or the Emergency Department for immediate evaluation.   I spent 15 minutes dedicated to the care of this patient, including pre-visit review of records, face to face time, post-visit ordering of testing and documentation.    Jake Shark, PA-C

## 2022-04-14 ENCOUNTER — Encounter: Payer: Self-pay | Admitting: Physician Assistant

## 2022-04-14 NOTE — Patient Instructions (Signed)
You can take OTC cough suppressant as needed like dextromethorphan (DM) in any OTC cough medicine that has the abbreviation DM.

## 2022-05-23 ENCOUNTER — Telehealth: Payer: Self-pay

## 2022-05-23 NOTE — Telephone Encounter (Signed)
error 

## 2022-07-23 ENCOUNTER — Encounter: Payer: Self-pay | Admitting: Internal Medicine

## 2022-08-26 ENCOUNTER — Encounter: Payer: Self-pay | Admitting: Internal Medicine

## 2022-09-08 ENCOUNTER — Encounter: Payer: Self-pay | Admitting: Internal Medicine

## 2022-12-20 DIAGNOSIS — R0981 Nasal congestion: Secondary | ICD-10-CM | POA: Diagnosis not present

## 2022-12-20 DIAGNOSIS — R059 Cough, unspecified: Secondary | ICD-10-CM | POA: Diagnosis not present

## 2022-12-20 DIAGNOSIS — U071 COVID-19: Secondary | ICD-10-CM | POA: Diagnosis not present

## 2023-02-03 ENCOUNTER — Other Ambulatory Visit: Payer: Self-pay | Admitting: Family Medicine

## 2023-02-03 DIAGNOSIS — I10 Essential (primary) hypertension: Secondary | ICD-10-CM

## 2023-02-12 ENCOUNTER — Encounter: Payer: Self-pay | Admitting: Nurse Practitioner

## 2023-02-12 ENCOUNTER — Telehealth (INDEPENDENT_AMBULATORY_CARE_PROVIDER_SITE_OTHER): Payer: BC Managed Care – PPO | Admitting: Nurse Practitioner

## 2023-02-12 VITALS — Temp 99.0°F | Wt 198.0 lb

## 2023-02-12 DIAGNOSIS — I10 Essential (primary) hypertension: Secondary | ICD-10-CM

## 2023-02-12 DIAGNOSIS — B9689 Other specified bacterial agents as the cause of diseases classified elsewhere: Secondary | ICD-10-CM | POA: Diagnosis not present

## 2023-02-12 DIAGNOSIS — J069 Acute upper respiratory infection, unspecified: Secondary | ICD-10-CM | POA: Diagnosis not present

## 2023-02-12 MED ORDER — AMLODIPINE BESYLATE 5 MG PO TABS: 5.0000 mg | ORAL_TABLET | Freq: Every day | ORAL | 0 refills | Status: DC

## 2023-02-12 MED ORDER — ALBUTEROL SULFATE HFA 108 (90 BASE) MCG/ACT IN AERS: 2.0000 | INHALATION_SPRAY | Freq: Four times a day (QID) | RESPIRATORY_TRACT | 0 refills | Status: AC | PRN

## 2023-02-12 MED ORDER — PREDNISONE 20 MG PO TABS: 40.0000 mg | ORAL_TABLET | Freq: Every day | ORAL | 0 refills | Status: DC

## 2023-02-12 MED ORDER — PROMETHAZINE-DM 6.25-15 MG/5ML PO SYRP: 5.0000 mL | ORAL_SOLUTION | Freq: Every day | ORAL | 0 refills | Status: DC

## 2023-02-12 MED ORDER — AZITHROMYCIN 250 MG PO TABS: ORAL_TABLET | ORAL | 0 refills | Status: AC

## 2023-02-12 NOTE — Progress Notes (Signed)
Virtual Visit Encounter mychart visit.   I connected with  Troy Garza on 02/23/23 at  2:15 PM EDT by secure video and audio telemedicine application. I verified that I am speaking with the correct person using two identifiers.   I introduced myself as a Publishing rights managerurse Practitioner with the practice. The limitations of evaluation and management by telemedicine discussed with the patient and the availability of in person appointments. The patient expressed verbal understanding and consent to proceed.  Participating parties in this visit include: Myself and patient  The patient is: Patient Location: Home I am: Provider Location: Office/Clinic Subjective:    CC and HPI: Troy NeighborsRobert Garza is a 62 y.o. year old male presenting for new evaluation and treatment of flu-like illness.   Troy MaduroRobert presents today with chief complaints of low-grade fever, cough, congestion, and body aches. He reports that his fever reached 101 degrees Fahrenheit yesterday but has since subsided. The patient has been self-medicating with Alka-Seltzer for symptom relief. He describes experiencing chest congestion with a rattling sensation, persistent body aches exacerbated by coughing, and headaches.  The patient's medical history is significant for a recent COVID-19 infection approximately one month prior, treated with paxlovid,  from which he recovered. However, he experienced another episode of illness with cold-like symptoms and a slight fever a week before the onset of his current symptoms, which began on Saturday. While the patient denies any significant sinus pressure, he does report headaches that may be sinus-related.  Additionally, the patient has a history of walking pneumonia and is currently experiencing wheezing and a productive cough. His symptoms, including difficulty sleeping due to coughing and body aches, have been particularly disruptive. The patient, who works night shifts, has been using over-the-counter cough medicine to  alleviate his symptoms. He reports an allergy to penicillin, which manifests as a rash. Furthermore, the patient has a history of high blood pressure, which is managed with medications including amlodipine, edarbyclor, and Carvedilol.  Past medical history, Surgical history, Family history not pertinant except as noted below, Social history, Allergies, and medications have been entered into the medical record, reviewed, and corrections made.   Review of Systems:  All review of systems negative except what is listed in the HPI  Objective:    Alert and oriented x 4 Coughing frequently with wet, course cough Audibly congested Speaking in clear sentences with no shortness of breath. No distress.  Impression and Recommendations:    Problem List Items Addressed This Visit     Essential hypertension    Refill provided on amlodipine today.       Relevant Medications   amLODipine (NORVASC) 5 MG tablet   Bacterial URI - Primary    Symptoms and presentation are consistent with acute bronchitis with secondary bacterial infection. The patient has a known allergy to penicillin.  Plan: - Azithromycin: Initiate with 2 tablets on day 1, followed by 1 tablet daily for 4 subsequent days. - Prednisone: Administer 2 tablets each morning for a duration of 5 days without tapering. - Albuterol inhaler: Use as needed for wheezing. - Promethazine DM for nighttime cough management.  - Advise the patient to follow up if symptoms do not improve by next week for potential lab work.      Relevant Medications   predniSONE (DELTASONE) 20 MG tablet   promethazine-dextromethorphan (PROMETHAZINE-DM) 6.25-15 MG/5ML syrup   albuterol (VENTOLIN HFA) 108 (90 Base) MCG/ACT inhaler    orders and follow up as documented in EMR I discussed the assessment and treatment plan  with the patient. The patient was provided an opportunity to ask questions and all were answered. The patient agreed with the plan and demonstrated  an understanding of the instructions.   The patient was advised to call back or seek an in-person evaluation if the symptoms worsen or if the condition fails to improve as anticipated.  Follow-Up: prn  I provided 22 minutes of non-face-to-face interaction with this non face-to-face encounter including intake, same-day documentation, and chart review.   Tollie Eth, NP , DNP, AGNP-c [facility]

## 2023-02-12 NOTE — Patient Instructions (Signed)
If you are still feeling bad into next week, please let us know and we can

## 2023-02-23 NOTE — Assessment & Plan Note (Signed)
Refill provided on amlodipine today.

## 2023-02-23 NOTE — Assessment & Plan Note (Addendum)
Symptoms and presentation are consistent with acute bronchitis with secondary bacterial infection. The patient has a known allergy to penicillin.  Plan: - Azithromycin: Initiate with 2 tablets on day 1, followed by 1 tablet daily for 4 subsequent days. - Prednisone: Administer 2 tablets each morning for a duration of 5 days without tapering. - Albuterol inhaler: Use as needed for wheezing. - Promethazine DM for nighttime cough management.  - Advise the patient to follow up if symptoms do not improve by next week for potential lab work.

## 2023-03-03 ENCOUNTER — Other Ambulatory Visit: Payer: Self-pay | Admitting: Family Medicine

## 2023-03-03 DIAGNOSIS — I10 Essential (primary) hypertension: Secondary | ICD-10-CM

## 2023-03-03 NOTE — Telephone Encounter (Signed)
Last refill given on 02/03/2023 has note that patient needs to schedule ov before next refill. Tried calling patient to schedule appointment. Left message to call back.

## 2023-03-04 NOTE — Telephone Encounter (Signed)
Tried calling patient. Left message to call back. 

## 2023-03-05 NOTE — Telephone Encounter (Signed)
3rd attempt. Tried calling patient. Left message to call back.

## 2023-06-27 ENCOUNTER — Other Ambulatory Visit: Payer: Self-pay | Admitting: Nurse Practitioner

## 2023-06-27 DIAGNOSIS — I10 Essential (primary) hypertension: Secondary | ICD-10-CM

## 2023-09-28 ENCOUNTER — Other Ambulatory Visit: Payer: Self-pay | Admitting: Nurse Practitioner

## 2023-09-28 DIAGNOSIS — I10 Essential (primary) hypertension: Secondary | ICD-10-CM

## 2023-10-25 ENCOUNTER — Other Ambulatory Visit: Payer: Self-pay | Admitting: Nurse Practitioner

## 2023-10-25 DIAGNOSIS — I10 Essential (primary) hypertension: Secondary | ICD-10-CM

## 2023-11-04 DIAGNOSIS — J189 Pneumonia, unspecified organism: Secondary | ICD-10-CM | POA: Diagnosis not present

## 2023-11-04 DIAGNOSIS — R059 Cough, unspecified: Secondary | ICD-10-CM | POA: Diagnosis not present

## 2023-11-22 ENCOUNTER — Other Ambulatory Visit: Payer: Self-pay | Admitting: Family Medicine

## 2023-11-22 DIAGNOSIS — I10 Essential (primary) hypertension: Secondary | ICD-10-CM

## 2023-12-02 ENCOUNTER — Other Ambulatory Visit: Payer: Self-pay | Admitting: Family Medicine

## 2023-12-02 DIAGNOSIS — I10 Essential (primary) hypertension: Secondary | ICD-10-CM

## 2023-12-02 NOTE — Telephone Encounter (Signed)
 Pt needs an appt. Called pt, no answer, left voicemail.

## 2023-12-03 NOTE — Telephone Encounter (Signed)
Waiting for pt to make an appt.

## 2023-12-08 NOTE — Telephone Encounter (Signed)
Left message asking patient to please schedule appt so blood pressure medication can be filled.

## 2023-12-09 ENCOUNTER — Other Ambulatory Visit: Payer: Self-pay | Admitting: Family Medicine

## 2023-12-09 DIAGNOSIS — I10 Essential (primary) hypertension: Secondary | ICD-10-CM

## 2023-12-09 NOTE — Telephone Encounter (Signed)
Pt called and made a appt for Jan the 30th would like enough amlodipine to get him to his appt

## 2023-12-17 ENCOUNTER — Encounter: Payer: Self-pay | Admitting: Family Medicine

## 2023-12-17 ENCOUNTER — Ambulatory Visit (INDEPENDENT_AMBULATORY_CARE_PROVIDER_SITE_OTHER): Payer: BC Managed Care – PPO | Admitting: Family Medicine

## 2023-12-17 VITALS — BP 172/96 | HR 88 | Wt 200.2 lb

## 2023-12-17 DIAGNOSIS — Z23 Encounter for immunization: Secondary | ICD-10-CM

## 2023-12-17 DIAGNOSIS — T464X5A Adverse effect of angiotensin-converting-enzyme inhibitors, initial encounter: Secondary | ICD-10-CM

## 2023-12-17 DIAGNOSIS — R058 Other specified cough: Secondary | ICD-10-CM

## 2023-12-17 DIAGNOSIS — I700 Atherosclerosis of aorta: Secondary | ICD-10-CM

## 2023-12-17 DIAGNOSIS — E291 Testicular hypofunction: Secondary | ICD-10-CM

## 2023-12-17 DIAGNOSIS — E7439 Other disorders of intestinal carbohydrate absorption: Secondary | ICD-10-CM

## 2023-12-17 DIAGNOSIS — E782 Mixed hyperlipidemia: Secondary | ICD-10-CM | POA: Diagnosis not present

## 2023-12-17 DIAGNOSIS — I10 Essential (primary) hypertension: Secondary | ICD-10-CM | POA: Diagnosis not present

## 2023-12-17 DIAGNOSIS — Z832 Family history of diseases of the blood and blood-forming organs and certain disorders involving the immune mechanism: Secondary | ICD-10-CM

## 2023-12-17 DIAGNOSIS — R7309 Other abnormal glucose: Secondary | ICD-10-CM | POA: Diagnosis not present

## 2023-12-17 DIAGNOSIS — Z1211 Encounter for screening for malignant neoplasm of colon: Secondary | ICD-10-CM

## 2023-12-17 MED ORDER — ROSUVASTATIN CALCIUM 20 MG PO TABS: 20.0000 mg | ORAL_TABLET | Freq: Every day | ORAL | 3 refills | Status: DC

## 2023-12-17 MED ORDER — AMLODIPINE BESYLATE 5 MG PO TABS: 5.0000 mg | ORAL_TABLET | Freq: Every day | ORAL | 3 refills | Status: DC

## 2023-12-17 MED ORDER — IRBESARTAN-HYDROCHLOROTHIAZIDE 300-12.5 MG PO TABS: 1.0000 | ORAL_TABLET | Freq: Every day | ORAL | 2 refills | Status: DC

## 2023-12-17 NOTE — Progress Notes (Signed)
   Subjective:    Patient ID: Troy Garza, male    DOB: 03/16/1961, 62 y.o.   MRN: [id number]  HPI He now has insurance and is back here for further care.  Presently he is taking amlodipine.  In the past he was given Product manager but his insurance would not cover it.  He also been tried on an ACE but it caused coughing.  He does have a previous history of hypogonadism but is not presently on any medications.  So has a history of aortic atherosclerosis and glucose intolerance..  There is also family history of factor V Leiden. His medical record reviewed in terms of immunizations and health maintenance. Review of Systems     Objective:    Physical Exam Alert and in no distress. Tympanic membranes and canals are normal. Pharyngeal area is normal. Neck is supple without adenopathy or thyromegaly. Cardiac exam shows a regular sinus rhythm without murmurs or gallops. Lungs are clear to auscultation.        Assessment & Plan:  Essential hypertension - Plan: CBC with Differential/Platelet, Comprehensive metabolic panel, amLODipine (NORVASC) 5 MG tablet, irbesartan-hydrochlorothiazide (AVALIDE) 300-12.5 MG tablet  Aortic atherosclerosis [facility])  Male hypogonadism - Plan: Testosterone  Mixed hyperlipidemia - Plan: Lipid panel, rosuvastatin (CRESTOR) 20 MG tablet  Glucose intolerance  Family history of factor V Leiden mutation  Screen for colon cancer - Plan: Cologuard  Need for influenza vaccination - Plan: Flu vaccine trivalent PF, 6mos and older(Flulaval,Afluria,Fluarix,Fluzone)  Need for shingles vaccine - Plan: Varicella-zoster vaccine IM Recommend he get a good blood pressure cuff and bring it in with him with his next visit so we can get accurate readings at home.

## 2023-12-17 NOTE — Patient Instructions (Signed)
Bring your blood pressure cuff with you with your next visit

## 2023-12-18 ENCOUNTER — Encounter: Payer: Self-pay | Admitting: Family Medicine

## 2023-12-18 LAB — CBC WITH DIFFERENTIAL/PLATELET
Basophils Absolute: 0.1 10*3/uL (ref 0.0–0.2)
Basos: 1 %
EOS (ABSOLUTE): 0.5 10*3/uL — ABNORMAL HIGH (ref 0.0–0.4)
Eos: 7 %
Hematocrit: 46.8 % (ref 37.5–51.0)
Hemoglobin: 15.4 g/dL (ref 13.0–17.7)
Immature Grans (Abs): 0.1 10*3/uL (ref 0.0–0.1)
Immature Granulocytes: 1 %
Lymphocytes Absolute: 1.3 10*3/uL (ref 0.7–3.1)
Lymphs: 19 %
MCH: 28.2 pg (ref 26.6–33.0)
MCHC: 32.9 g/dL (ref 31.5–35.7)
MCV: 86 fL (ref 79–97)
Monocytes Absolute: 0.6 10*3/uL (ref 0.1–0.9)
Monocytes: 9 %
Neutrophils Absolute: 4.4 10*3/uL (ref 1.4–7.0)
Neutrophils: 63 %
Platelets: 235 10*3/uL (ref 150–450)
RBC: 5.46 x10E6/uL (ref 4.14–5.80)
RDW: 13.2 % (ref 11.6–15.4)
WBC: 6.9 10*3/uL (ref 3.4–10.8)

## 2023-12-18 LAB — COMPREHENSIVE METABOLIC PANEL
ALT: 25 [IU]/L (ref 0–44)
AST: 22 [IU]/L (ref 0–40)
Albumin: 4.4 g/dL (ref 3.9–4.9)
Alkaline Phosphatase: 118 [IU]/L (ref 44–121)
BUN/Creatinine Ratio: 24 (ref 10–24)
BUN: 20 mg/dL (ref 8–27)
Bilirubin Total: 0.4 mg/dL (ref 0.0–1.2)
CO2: 21 mmol/L (ref 20–29)
Calcium: 9.7 mg/dL (ref 8.6–10.2)
Chloride: 104 mmol/L (ref 96–106)
Creatinine, Ser: 0.85 mg/dL (ref 0.76–1.27)
Globulin, Total: 2.5 g/dL (ref 1.5–4.5)
Glucose: 141 mg/dL — ABNORMAL HIGH (ref 70–99)
Potassium: 3.9 mmol/L (ref 3.5–5.2)
Sodium: 141 mmol/L (ref 134–144)
Total Protein: 6.9 g/dL (ref 6.0–8.5)
eGFR: 99 mL/min/{1.73_m2} (ref >59)

## 2023-12-18 LAB — LIPID PANEL
Chol/HDL Ratio: 4.6 {ratio} (ref 0.0–5.0)
Cholesterol, Total: 236 mg/dL — ABNORMAL HIGH (ref 100–199)
HDL: 51 mg/dL (ref >39)
LDL Chol Calc (NIH): 168 mg/dL — ABNORMAL HIGH (ref 0–99)
Triglycerides: 96 mg/dL (ref 0–149)
VLDL Cholesterol Cal: 17 mg/dL (ref 5–40)

## 2023-12-18 LAB — TESTOSTERONE: Testosterone: 300 ng/dL (ref 264–916)

## 2023-12-21 LAB — SPECIMEN STATUS REPORT

## 2023-12-21 LAB — HGB A1C W/O EAG: Hgb A1c MFr Bld: 6.5 % — ABNORMAL HIGH (ref 4.8–5.6)

## 2023-12-29 DIAGNOSIS — Z1211 Encounter for screening for malignant neoplasm of colon: Secondary | ICD-10-CM | POA: Diagnosis not present

## 2024-01-05 LAB — COLOGUARD: COLOGUARD: NEGATIVE

## 2024-01-12 ENCOUNTER — Encounter: Payer: Self-pay | Admitting: Internal Medicine

## 2024-01-18 ENCOUNTER — Ambulatory Visit: Payer: BC Managed Care – PPO | Admitting: Family Medicine

## 2024-01-26 ENCOUNTER — Ambulatory Visit: Admitting: Family Medicine

## 2024-01-26 ENCOUNTER — Encounter: Payer: Self-pay | Admitting: Family Medicine

## 2024-01-26 VITALS — BP 140/88 | HR 84 | Ht 69.0 in | Wt 202.2 lb

## 2024-01-26 DIAGNOSIS — I10 Essential (primary) hypertension: Secondary | ICD-10-CM | POA: Diagnosis not present

## 2024-01-26 NOTE — Progress Notes (Signed)
   Subjective:    Patient ID: Troy Garza, male    DOB: [birth year], 63 y.o.   MRN: [id number]  HPI He is here for recheck.  He continues on his present medication regimen including amlodipine, irbesartan and Coreg.  He is quite active with his job.  He has made some dietary changes as well.   Review of Systems     Objective:    Physical Exam  Alert and in no distress.  Blood pressure is recorded.      Assessment & Plan:  Essential hypertension I will have him double the amlodipine.  Cautioned that there could be some edema and to let me know.  He is to return here in 1 month and bring his blood pressure cuff with him.

## 2024-03-03 ENCOUNTER — Ambulatory Visit (INDEPENDENT_AMBULATORY_CARE_PROVIDER_SITE_OTHER): Admitting: Family Medicine

## 2024-03-03 VITALS — BP 122/80 | HR 84 | Wt 200.6 lb

## 2024-03-03 DIAGNOSIS — I10 Essential (primary) hypertension: Secondary | ICD-10-CM

## 2024-03-03 MED ORDER — AMLODIPINE BESYLATE 10 MG PO TABS: 10.0000 mg | ORAL_TABLET | Freq: Every day | ORAL | 3 refills | Status: AC

## 2024-03-03 NOTE — Progress Notes (Signed)
   Subjective:    Patient ID: Troy Garza, male    DOB: September 14, 1961, 63 y.o.   MRN: [id number]  HPI He is here for a recheck on his blood pressure.  He notes that his blood pressure is now under good control   Review of Systems     Objective:    Physical Exam Alert and in no distress otherwise not examined       Assessment & Plan:  Essential hypertension - Plan: amLODipine (NORVASC) 10 MG tablet He is happy with the results as MI.  Recheck here in about a year or sooner if needed.

## 2024-03-13 ENCOUNTER — Other Ambulatory Visit: Payer: Self-pay | Admitting: Family Medicine

## 2024-03-13 DIAGNOSIS — I10 Essential (primary) hypertension: Secondary | ICD-10-CM

## 2024-06-12 ENCOUNTER — Other Ambulatory Visit: Payer: Self-pay | Admitting: Family Medicine

## 2024-06-12 DIAGNOSIS — I10 Essential (primary) hypertension: Secondary | ICD-10-CM

## 2024-09-14 ENCOUNTER — Other Ambulatory Visit: Payer: Self-pay | Admitting: Family Medicine

## 2024-09-14 DIAGNOSIS — I10 Essential (primary) hypertension: Secondary | ICD-10-CM

## 2024-12-17 ENCOUNTER — Other Ambulatory Visit: Payer: Self-pay | Admitting: Family Medicine

## 2024-12-17 DIAGNOSIS — I10 Essential (primary) hypertension: Secondary | ICD-10-CM

## 2024-12-19 ENCOUNTER — Other Ambulatory Visit: Payer: Self-pay | Admitting: Family Medicine

## 2024-12-19 DIAGNOSIS — E782 Mixed hyperlipidemia: Secondary | ICD-10-CM

## 2024-12-20 ENCOUNTER — Other Ambulatory Visit: Payer: Self-pay | Admitting: Family Medicine

## 2024-12-20 DIAGNOSIS — I10 Essential (primary) hypertension: Secondary | ICD-10-CM
